# Patient Record
Sex: Female | Born: 1965 | Race: White | Hispanic: No | Marital: Married | Smoking: Never smoker
Health system: Southern US, Community
[De-identification: ages and names within clinical notes are randomized; demographics above are authoritative.]

## PROBLEM LIST (undated history)

## (undated) DIAGNOSIS — F329 Major depressive disorder, single episode, unspecified: Secondary | ICD-10-CM

## (undated) DIAGNOSIS — F32A Depression, unspecified: Secondary | ICD-10-CM

## (undated) DIAGNOSIS — N800 Endometriosis of uterus: Secondary | ICD-10-CM

## (undated) DIAGNOSIS — N809 Endometriosis, unspecified: Secondary | ICD-10-CM

## (undated) DIAGNOSIS — N8003 Adenomyosis of the uterus: Secondary | ICD-10-CM

## (undated) DIAGNOSIS — S0300XA Dislocation of jaw, unspecified side, initial encounter: Secondary | ICD-10-CM

## (undated) DIAGNOSIS — K589 Irritable bowel syndrome without diarrhea: Secondary | ICD-10-CM

## (undated) DIAGNOSIS — N946 Dysmenorrhea, unspecified: Secondary | ICD-10-CM

## (undated) DIAGNOSIS — K648 Other hemorrhoids: Secondary | ICD-10-CM

## (undated) DIAGNOSIS — F419 Anxiety disorder, unspecified: Secondary | ICD-10-CM

## (undated) DIAGNOSIS — N879 Dysplasia of cervix uteri, unspecified: Secondary | ICD-10-CM

## (undated) DIAGNOSIS — K5909 Other constipation: Secondary | ICD-10-CM

## (undated) DIAGNOSIS — E079 Disorder of thyroid, unspecified: Secondary | ICD-10-CM

## (undated) DIAGNOSIS — N898 Other specified noninflammatory disorders of vagina: Secondary | ICD-10-CM

## (undated) DIAGNOSIS — E538 Deficiency of other specified B group vitamins: Secondary | ICD-10-CM

## (undated) DIAGNOSIS — Z780 Asymptomatic menopausal state: Secondary | ICD-10-CM

## (undated) DIAGNOSIS — G43909 Migraine, unspecified, not intractable, without status migrainosus: Secondary | ICD-10-CM

## (undated) HISTORY — DX: Anxiety disorder, unspecified: F41.9

## (undated) HISTORY — DX: Depression, unspecified: F32.A

## (undated) HISTORY — DX: Migraine, unspecified, not intractable, without status migrainosus: G43.909

## (undated) HISTORY — PX: CARPAL TUNNEL RELEASE: SHX101

## (undated) HISTORY — DX: Hemochromatosis, unspecified: E83.119

## (undated) HISTORY — PX: HEMORRHOID BANDING: SHX5850

## (undated) HISTORY — DX: Other specified noninflammatory disorders of vagina: N89.8

## (undated) HISTORY — DX: Major depressive disorder, single episode, unspecified: F32.9

## (undated) HISTORY — PX: CERVICAL CONIZATION W/BX: SHX1330

## (undated) HISTORY — DX: Irritable bowel syndrome, unspecified: K58.9

## (undated) HISTORY — DX: Dysmenorrhea, unspecified: N94.6

## (undated) HISTORY — DX: Asymptomatic menopausal state: Z78.0

## (undated) HISTORY — DX: Other hemorrhoids: K64.8

## (undated) HISTORY — DX: Disorder of thyroid, unspecified: E07.9

## (undated) HISTORY — DX: Adenomyosis of the uterus: N80.03

## (undated) HISTORY — DX: Dislocation of jaw, unspecified side, initial encounter: S03.00XA

## (undated) HISTORY — PX: CERVICAL CERCLAGE: SHX1329

## (undated) HISTORY — DX: Endometriosis of uterus: N80.0

## (undated) HISTORY — DX: Dysplasia of cervix uteri, unspecified: N87.9

## (undated) HISTORY — DX: Other constipation: K59.09

## (undated) HISTORY — PX: LEEP: SHX91

## (undated) HISTORY — DX: Endometriosis, unspecified: N80.9

## (undated) HISTORY — DX: Deficiency of other specified B group vitamins: E53.8

---

## 2003-04-23 ENCOUNTER — Other Ambulatory Visit: Admission: RE | Admit: 2003-04-23 | Discharge: 2003-04-23 | Payer: Self-pay | Admitting: Obstetrics & Gynecology

## 2004-07-11 ENCOUNTER — Ambulatory Visit: Payer: Self-pay | Admitting: Obstetrics and Gynecology

## 2005-04-24 ENCOUNTER — Ambulatory Visit: Payer: Self-pay | Admitting: Psychiatry

## 2005-08-10 ENCOUNTER — Ambulatory Visit: Payer: Self-pay | Admitting: Rheumatology

## 2006-03-04 ENCOUNTER — Ambulatory Visit: Payer: Self-pay | Admitting: Internal Medicine

## 2006-03-10 ENCOUNTER — Ambulatory Visit: Payer: Self-pay | Admitting: Gynecologic Oncology

## 2006-04-21 ENCOUNTER — Ambulatory Visit: Payer: Self-pay | Admitting: Gynecologic Oncology

## 2006-06-01 ENCOUNTER — Ambulatory Visit: Payer: Self-pay | Admitting: Gynecologic Oncology

## 2007-02-17 ENCOUNTER — Ambulatory Visit: Payer: Self-pay

## 2007-02-19 ENCOUNTER — Ambulatory Visit: Payer: Self-pay | Admitting: Gastroenterology

## 2007-10-18 ENCOUNTER — Ambulatory Visit: Payer: Self-pay | Admitting: Gynecologic Oncology

## 2008-09-27 ENCOUNTER — Ambulatory Visit: Payer: Self-pay

## 2011-10-07 ENCOUNTER — Ambulatory Visit: Payer: Self-pay | Admitting: Obstetrics and Gynecology

## 2012-11-04 LAB — HM PAP SMEAR: HM PAP: NEGATIVE

## 2012-11-10 ENCOUNTER — Ambulatory Visit: Payer: Self-pay | Admitting: Obstetrics and Gynecology

## 2014-02-08 ENCOUNTER — Ambulatory Visit: Payer: Self-pay | Admitting: Obstetrics and Gynecology

## 2015-01-22 ENCOUNTER — Encounter: Payer: Self-pay | Admitting: Obstetrics and Gynecology

## 2015-02-07 ENCOUNTER — Ambulatory Visit (INDEPENDENT_AMBULATORY_CARE_PROVIDER_SITE_OTHER): Payer: BC Managed Care – PPO | Admitting: Obstetrics and Gynecology

## 2015-02-07 ENCOUNTER — Encounter: Payer: Self-pay | Admitting: Obstetrics and Gynecology

## 2015-02-07 VITALS — BP 123/76 | HR 76 | Ht 65.0 in | Wt 187.8 lb

## 2015-02-07 DIAGNOSIS — K589 Irritable bowel syndrome without diarrhea: Secondary | ICD-10-CM | POA: Diagnosis not present

## 2015-02-07 DIAGNOSIS — Z1239 Encounter for other screening for malignant neoplasm of breast: Secondary | ICD-10-CM

## 2015-02-07 DIAGNOSIS — K648 Other hemorrhoids: Secondary | ICD-10-CM

## 2015-02-07 DIAGNOSIS — Z0000 Encounter for general adult medical examination without abnormal findings: Secondary | ICD-10-CM | POA: Diagnosis not present

## 2015-02-07 DIAGNOSIS — N951 Menopausal and female climacteric states: Secondary | ICD-10-CM | POA: Insufficient documentation

## 2015-02-07 DIAGNOSIS — G43909 Migraine, unspecified, not intractable, without status migrainosus: Secondary | ICD-10-CM | POA: Insufficient documentation

## 2015-02-07 DIAGNOSIS — F418 Other specified anxiety disorders: Secondary | ICD-10-CM | POA: Insufficient documentation

## 2015-02-07 DIAGNOSIS — N809 Endometriosis, unspecified: Secondary | ICD-10-CM

## 2015-02-07 DIAGNOSIS — S0300XA Dislocation of jaw, unspecified side, initial encounter: Secondary | ICD-10-CM | POA: Insufficient documentation

## 2015-02-07 DIAGNOSIS — N8003 Adenomyosis of the uterus: Secondary | ICD-10-CM | POA: Insufficient documentation

## 2015-02-07 DIAGNOSIS — Z90710 Acquired absence of both cervix and uterus: Secondary | ICD-10-CM

## 2015-02-07 DIAGNOSIS — R638 Other symptoms and signs concerning food and fluid intake: Secondary | ICD-10-CM

## 2015-02-07 MED ORDER — ESTRADIOL 0.025 MG/24HR TD PTTW: 1.0000 | MEDICATED_PATCH | TRANSDERMAL | Status: DC

## 2015-02-07 MED ORDER — ESTRADIOL 0.1 MG/GM VA CREA: 1.0000 | TOPICAL_CREAM | Freq: Every day | VAGINAL | Status: AC

## 2015-02-07 MED ORDER — HYDROCORTISONE ACE-PRAMOXINE 1-1 % RE FOAM: 1.0000 | Freq: Two times a day (BID) | RECTAL | Status: DC

## 2015-02-07 NOTE — Patient Instructions (Addendum)
1.  No Pap smear this year.  We'll obtain Pap in 2017. 2.  Mammogram scheduled. 3.  Screening lab work ordered. 4.  Continue with healthy eating and exercise and ongoing weight loss!!. 5.  Return in 1 year for annual exam Preventive Care for Adults, Female A healthy lifestyle and preventive care can promote health and wellness. Preventive health guidelines for women include the following key practices.  A routine yearly physical is a good way to check with your health care provider about your health and preventive screening. It is a chance to share any concerns and updates on your health and to receive a thorough exam.  Visit your dentist for a routine exam and preventive care every 6 months. Brush your teeth twice a day and floss once a day. Good oral hygiene prevents tooth decay and gum disease.  The frequency of eye exams is based on your age, health, family medical history, use of contact lenses, and other factors. Follow your health care provider's recommendations for frequency of eye exams.  Eat a healthy diet. Foods like vegetables, fruits, whole grains, low-fat dairy products, and lean protein foods contain the nutrients you need without too many calories. Decrease your intake of foods high in solid fats, added sugars, and salt. Eat the right amount of calories for you.Get information about a proper diet from your health care provider, if necessary.  Regular physical exercise is one of the most important things you can do for your health. Most adults should get at least 150 minutes of moderate-intensity exercise (any activity that increases your heart rate and causes you to sweat) each week. In addition, most adults need muscle-strengthening exercises on 2 or more days a week.  Maintain a healthy weight. The body mass index (BMI) is a screening tool to identify possible weight problems. It provides an estimate of body fat based on height and weight. Your health care provider can find your  BMI and can help you achieve or maintain a healthy weight.For adults 20 years and older:  A BMI below 18.5 is considered underweight.  A BMI of 18.5 to 24.9 is normal.  A BMI of 25 to 29.9 is considered overweight.  A BMI of 30 and above is considered obese.  Maintain normal blood lipids and cholesterol levels by exercising and minimizing your intake of saturated fat. Eat a balanced diet with plenty of fruit and vegetables. Blood tests for lipids and cholesterol should begin at age 60 and be repeated every 5 years. If your lipid or cholesterol levels are high, you are over 50, or you are at high risk for heart disease, you may need your cholesterol levels checked more frequently.Ongoing high lipid and cholesterol levels should be treated with medicines if diet and exercise are not working.  If you smoke, find out from your health care provider how to quit. If you do not use tobacco, do not start.  Lung cancer screening is recommended for adults aged 72-80 years who are at high risk for developing lung cancer because of a history of smoking. A yearly low-dose CT scan of the lungs is recommended for people who have at least a 30-pack-year history of smoking and are a current smoker or have quit within the past 15 years. A pack year of smoking is smoking an average of 1 pack of cigarettes a day for 1 year (for example: 1 pack a day for 30 years or 2 packs a day for 15 years). Yearly screening should continue  until the smoker has stopped smoking for at least 15 years. Yearly screening should be stopped for people who develop a health problem that would prevent them from having lung cancer treatment.  If you are pregnant, do not drink alcohol. If you are breastfeeding, be very cautious about drinking alcohol. If you are not pregnant and choose to drink alcohol, do not have more than 1 drink per day. One drink is considered to be 12 ounces (355 mL) of beer, 5 ounces (148 mL) of wine, or 1.5 ounces (44  mL) of liquor.  Avoid use of street drugs. Do not share needles with anyone. Ask for help if you need support or instructions about stopping the use of drugs.  High blood pressure causes heart disease and increases the risk of stroke. Your blood pressure should be checked at least every 1 to 2 years. Ongoing high blood pressure should be treated with medicines if weight loss and exercise do not work.  If you are 87-28 years old, ask your health care provider if you should take aspirin to prevent strokes.  Diabetes screening is done by taking a blood sample to check your blood glucose level after you have not eaten for a certain period of time (fasting). If you are not overweight and you do not have risk factors for diabetes, you should be screened once every 3 years starting at age 48. If you are overweight or obese and you are 53-9 years of age, you should be screened for diabetes every year as part of your cardiovascular risk assessment.  Breast cancer screening is essential preventive care for women. You should practice "breast self-awareness." This means understanding the normal appearance and feel of your breasts and may include breast self-examination. Any changes detected, no matter how small, should be reported to a health care provider. Women in their 51s and 30s should have a clinical breast exam (CBE) by a health care provider as part of a regular health exam every 1 to 3 years. After age 62, women should have a CBE every year. Starting at age 55, women should consider having a mammogram (breast X-ray test) every year. Women who have a family history of breast cancer should talk to their health care provider about genetic screening. Women at a high risk of breast cancer should talk to their health care providers about having an MRI and a mammogram every year.  Breast cancer gene (BRCA)-related cancer risk assessment is recommended for women who have family members with BRCA-related cancers.  BRCA-related cancers include breast, ovarian, tubal, and peritoneal cancers. Having family members with these cancers may be associated with an increased risk for harmful changes (mutations) in the breast cancer genes BRCA1 and BRCA2. Results of the assessment will determine the need for genetic counseling and BRCA1 and BRCA2 testing.  Your health care provider may recommend that you be screened regularly for cancer of the pelvic organs (ovaries, uterus, and vagina). This screening involves a pelvic examination, including checking for microscopic changes to the surface of your cervix (Pap test). You may be encouraged to have this screening done every 3 years, beginning at age 46.  For women ages 55-65, health care providers may recommend pelvic exams and Pap testing every 3 years, or they may recommend the Pap and pelvic exam, combined with testing for human papilloma virus (HPV), every 5 years. Some types of HPV increase your risk of cervical cancer. Testing for HPV may also be done on women of any age with  unclear Pap test results.  Other health care providers may not recommend any screening for nonpregnant women who are considered low risk for pelvic cancer and who do not have symptoms. Ask your health care provider if a screening pelvic exam is right for you.  If you have had past treatment for cervical cancer or a condition that could lead to cancer, you need Pap tests and screening for cancer for at least 20 years after your treatment. If Pap tests have been discontinued, your risk factors (such as having a new sexual partner) need to be reassessed to determine if screening should resume. Some women have medical problems that increase the chance of getting cervical cancer. In these cases, your health care provider may recommend more frequent screening and Pap tests.  Colorectal cancer can be detected and often prevented. Most routine colorectal cancer screening begins at the age of 83 years and  continues through age 29 years. However, your health care provider may recommend screening at an earlier age if you have risk factors for colon cancer. On a yearly basis, your health care provider may provide home test kits to check for hidden blood in the stool. Use of a small camera at the end of a tube, to directly examine the colon (sigmoidoscopy or colonoscopy), can detect the earliest forms of colorectal cancer. Talk to your health care provider about this at age 67, when routine screening begins. Direct exam of the colon should be repeated every 5-10 years through age 12 years, unless early forms of precancerous polyps or small growths are found.  People who are at an increased risk for hepatitis B should be screened for this virus. You are considered at high risk for hepatitis B if:  You were born in a country where hepatitis B occurs often. Talk with your health care provider about which countries are considered high risk.  Your parents were born in a high-risk country and you have not received a shot to protect against hepatitis B (hepatitis B vaccine).  You have HIV or AIDS.  You use needles to inject street drugs.  You live with, or have sex with, someone who has hepatitis B.  You get hemodialysis treatment.  You take certain medicines for conditions like cancer, organ transplantation, and autoimmune conditions.  Hepatitis C blood testing is recommended for all people born from [birth year] through 1965 and any individual with known risks for hepatitis C.  Practice safe sex. Use condoms and avoid high-risk sexual practices to reduce the spread of sexually transmitted infections (STIs). STIs include gonorrhea, chlamydia, syphilis, trichomonas, herpes, HPV, and human immunodeficiency virus (HIV). Herpes, HIV, and HPV are viral illnesses that have no cure. They can result in disability, cancer, and death.  You should be screened for sexually transmitted illnesses (STIs) including gonorrhea  and chlamydia if:  You are sexually active and are younger than 24 years.  You are older than 24 years and your health care provider tells you that you are at risk for this type of infection.  Your sexual activity has changed since you were last screened and you are at an increased risk for chlamydia or gonorrhea. Ask your health care provider if you are at risk.  If you are at risk of being infected with HIV, it is recommended that you take a prescription medicine daily to prevent HIV infection. This is called preexposure prophylaxis (PrEP). You are considered at risk if:  You are sexually active and do not regularly use condoms or  know the HIV status of your partner(s).  You take drugs by injection.  You are sexually active with a partner who has HIV.  Talk with your health care provider about whether you are at high risk of being infected with HIV. If you choose to begin PrEP, you should first be tested for HIV. You should then be tested every 3 months for as long as you are taking PrEP.  Osteoporosis is a disease in which the bones lose minerals and strength with aging. This can result in serious bone fractures or breaks. The risk of osteoporosis can be identified using a bone density scan. Women ages 31 years and over and women at risk for fractures or osteoporosis should discuss screening with their health care providers. Ask your health care provider whether you should take a calcium supplement or vitamin D to reduce the rate of osteoporosis.  Menopause can be associated with physical symptoms and risks. Hormone replacement therapy is available to decrease symptoms and risks. You should talk to your health care provider about whether hormone replacement therapy is right for you.  Use sunscreen. Apply sunscreen liberally and repeatedly throughout the day. You should seek shade when your shadow is shorter than you. Protect yourself by wearing long sleeves, pants, a wide-brimmed hat, and  sunglasses year round, whenever you are outdoors.  Once a month, do a whole body skin exam, using a mirror to look at the skin on your back. Tell your health care provider of new moles, moles that have irregular borders, moles that are larger than a pencil eraser, or moles that have changed in shape or color.  Stay current with required vaccines (immunizations).  Influenza vaccine. All adults should be immunized every year.  Tetanus, diphtheria, and acellular pertussis (Td, Tdap) vaccine. Pregnant women should receive 1 dose of Tdap vaccine during each pregnancy. The dose should be obtained regardless of the length of time since the last dose. Immunization is preferred during the 27th-36th week of gestation. An adult who has not previously received Tdap or who does not know her vaccine status should receive 1 dose of Tdap. This initial dose should be followed by tetanus and diphtheria toxoids (Td) booster doses every 10 years. Adults with an unknown or incomplete history of completing a 3-dose immunization series with Td-containing vaccines should begin or complete a primary immunization series including a Tdap dose. Adults should receive a Td booster every 10 years.  Varicella vaccine. An adult without evidence of immunity to varicella should receive 2 doses or a second dose if she has previously received 1 dose. Pregnant females who do not have evidence of immunity should receive the first dose after pregnancy. This first dose should be obtained before leaving the health care facility. The second dose should be obtained 4-8 weeks after the first dose.  Human papillomavirus (HPV) vaccine. Females aged 13-26 years who have not received the vaccine previously should obtain the 3-dose series. The vaccine is not recommended for use in pregnant females. However, pregnancy testing is not needed before receiving a dose. If a female is found to be pregnant after receiving a dose, no treatment is needed. In that  case, the remaining doses should be delayed until after the pregnancy. Immunization is recommended for any person with an immunocompromised condition through the age of 66 years if she did not get any or all doses earlier. During the 3-dose series, the second dose should be obtained 4-8 weeks after the first dose. The third dose  should be obtained 24 weeks after the first dose and 16 weeks after the second dose.  Zoster vaccine. One dose is recommended for adults aged 83 years or older unless certain conditions are present.  Measles, mumps, and rubella (MMR) vaccine. Adults born before [birth year] generally are considered immune to measles and mumps. Adults born in [birth year] or later should have 1 or more doses of MMR vaccine unless there is a contraindication to the vaccine or there is laboratory evidence of immunity to each of the three diseases. A routine second dose of MMR vaccine should be obtained at least 28 days after the first dose for students attending postsecondary schools, health care workers, or international travelers. People who received inactivated measles vaccine or an unknown type of measles vaccine during 1963-1967 should receive 2 doses of MMR vaccine. People who received inactivated mumps vaccine or an unknown type of mumps vaccine before 1979 and are at high risk for mumps infection should consider immunization with 2 doses of MMR vaccine. For females of childbearing age, rubella immunity should be determined. If there is no evidence of immunity, females who are not pregnant should be vaccinated. If there is no evidence of immunity, females who are pregnant should delay immunization until after pregnancy. Unvaccinated health care workers born before [birth year] who lack laboratory evidence of measles, mumps, or rubella immunity or laboratory confirmation of disease should consider measles and mumps immunization with 2 doses of MMR vaccine or rubella immunization with 1 dose of MMR vaccine.  Pneumococcal  13-valent conjugate (PCV13) vaccine. When indicated, a person who is uncertain of his immunization history and has no record of immunization should receive the PCV13 vaccine. All adults 71 years of age and older should receive this vaccine. An adult aged 100 years or older who has certain medical conditions and has not been previously immunized should receive 1 dose of PCV13 vaccine. This PCV13 should be followed with a dose of pneumococcal polysaccharide (PPSV23) vaccine. Adults who are at high risk for pneumococcal disease should obtain the PPSV23 vaccine at least 8 weeks after the dose of PCV13 vaccine. Adults older than 49 years of age who have normal immune system function should obtain the PPSV23 vaccine dose at least 1 year after the dose of PCV13 vaccine.  Pneumococcal polysaccharide (PPSV23) vaccine. When PCV13 is also indicated, PCV13 should be obtained first. All adults aged 28 years and older should be immunized. An adult younger than age 57 years who has certain medical conditions should be immunized. Any person who resides in a nursing home or long-term care facility should be immunized. An adult smoker should be immunized. People with an immunocompromised condition and certain other conditions should receive both PCV13 and PPSV23 vaccines. People with human immunodeficiency virus (HIV) infection should be immunized as soon as possible after diagnosis. Immunization during chemotherapy or radiation therapy should be avoided. Routine use of PPSV23 vaccine is not recommended for American Indians, [place] Natives, or people younger than 65 years unless there are medical conditions that require PPSV23 vaccine. When indicated, people who have unknown immunization and have no record of immunization should receive PPSV23 vaccine. One-time revaccination 5 years after the first dose of PPSV23 is recommended for people aged 19-64 years who have chronic kidney failure, nephrotic syndrome, asplenia, or  immunocompromised conditions. People who received 1-2 doses of PPSV23 before age 71 years should receive another dose of PPSV23 vaccine at age 57 years or later if at least 5 years have passed since the previous  dose. Doses of PPSV23 are not needed for people immunized with PPSV23 at or after age 63 years.  Meningococcal vaccine. Adults with asplenia or persistent complement component deficiencies should receive 2 doses of quadrivalent meningococcal conjugate (MenACWY-D) vaccine. The doses should be obtained at least 2 months apart. Microbiologists working with certain meningococcal bacteria, [employer] recruits, people at risk during an outbreak, and people who travel to or live in countries with a high rate of meningitis should be immunized. A first-year college student up through age 37 years who is living in a residence hall should receive a dose if she did not receive a dose on or after her 16th birthday. Adults who have certain high-risk conditions should receive one or more doses of vaccine.  Hepatitis A vaccine. Adults who wish to be protected from this disease, have certain high-risk conditions, work with hepatitis A-infected animals, work in hepatitis A research labs, or travel to or work in countries with a high rate of hepatitis A should be immunized. Adults who were previously unvaccinated and who anticipate close contact with an international adoptee during the first 60 days after arrival in the [place] from a country with a high rate of hepatitis A should be immunized.  Hepatitis B vaccine. Adults who wish to be protected from this disease, have certain high-risk conditions, may be exposed to blood or other infectious body fluids, are household contacts or sex partners of hepatitis B positive people, are clients or workers in certain care facilities, or travel to or work in countries with a high rate of hepatitis B should be immunized.  Haemophilus influenzae type b (Hib) vaccine. A  previously unvaccinated person with asplenia or sickle cell disease or having a scheduled splenectomy should receive 1 dose of Hib vaccine. Regardless of previous immunization, a recipient of a hematopoietic stem cell transplant should receive a 3-dose series 6-12 months after her successful transplant. Hib vaccine is not recommended for adults with HIV infection. Preventive Services / Frequency   Ages 74 to 12 years  Blood pressure check.** / Every year.  Lipid and cholesterol check.** / Every 5 years beginning at age 44 years.  Lung cancer screening. / Every year if you are aged 90-80 years and have a 30-pack-year history of smoking and currently smoke or have quit within the past 15 years. Yearly screening is stopped once you have quit smoking for at least 15 years or develop a health problem that would prevent you from having lung cancer treatment.  Clinical breast exam.** / Every year after age 69 years.  BRCA-related cancer risk assessment.** / For women who have family members with a BRCA-related cancer (breast, ovarian, tubal, or peritoneal cancers).  Mammogram.** / Every year beginning at age 93 years and continuing for as long as you are in good health. Consult with your health care provider.  Pap test.** / Every 3 years starting at age 37 years through age 33 or 81 years with a history of 3 consecutive normal Pap tests.  HPV screening.** / Every 3 years from ages 40 years through ages 31 to 5 years with a history of 3 consecutive normal Pap tests.  Fecal occult blood test (FOBT) of stool. / Every year beginning at age 81 years and continuing until age 6 years. You may not need to do this test if you get a colonoscopy every 10 years.  Flexible sigmoidoscopy or colonoscopy.** / Every 5 years for a flexible sigmoidoscopy or every 10 years for a colonoscopy beginning  at age 90 years and continuing until age 21 years.  Hepatitis C blood test.** / For all people born from [birth year]  through 1965 and any individual with known risks for hepatitis C.  Skin self-exam. / Monthly.  Influenza vaccine. / Every year.  Tetanus, diphtheria, and acellular pertussis (Tdap/Td) vaccine.** / Consult your health care provider. Pregnant women should receive 1 dose of Tdap vaccine during each pregnancy. 1 dose of Td every 10 years.  Varicella vaccine.** / Consult your health care provider. Pregnant females who do not have evidence of immunity should receive the first dose after pregnancy.  Zoster vaccine.** / 1 dose for adults aged 57 years or older.  Measles, mumps, rubella (MMR) vaccine.** / You need at least 1 dose of MMR if you were born in 1957 or later. You may also need a second dose. For females of childbearing age, rubella immunity should be determined. If there is no evidence of immunity, females who are not pregnant should be vaccinated. If there is no evidence of immunity, females who are pregnant should delay immunization until after pregnancy.  Pneumococcal 13-valent conjugate (PCV13) vaccine.** / Consult your health care provider.  Pneumococcal polysaccharide (PPSV23) vaccine.** / 1 to 2 doses if you smoke cigarettes or if you have certain conditions.  Meningococcal vaccine.** / Consult your health care provider.  Hepatitis A vaccine.** / Consult your health care provider.  Hepatitis B vaccine.** / Consult your health care provider.  Haemophilus influenzae type b (Hib) vaccine.** / Consult your health care provider. h care provider's recommendations.   This information is not intended to replace advice given to you by your health care provider. Make sure you discuss any questions you have with your health care provider.   Document Released: 04/21/2001 Document Revised: 03/16/2014 Document Reviewed: 07/21/2010 Elsevier Interactive Patient Education [employer].

## 2015-02-07 NOTE — Progress Notes (Signed)
Patient ID: Virginia Oconnor, female   DOB: 01/25/1966, 49 y.o.   MRN: [id number] ANNUAL PREVENTATIVE CARE GYN  ENCOUNTER NOTE  Subjective:       Virginia KirkDawn Sautters Bulow is a 49 y.o. [phone] female here for a routine annual gynecologic exam.  Current complaints: 1.  none   Patient has lost 13 pounds this year.  She is on Synthroid for hypothyroidism.  Bowel and bladder function are normal.   Gynecologic History No LMP recorded. Patient has had a hysterectomy. Contraception: status post hysterectomyTransvaginal hysterectomy Last Pap: 10/2012 n/n. Results were: normal Last mammogram: 02/09/2014 neg. Results were: normal  Obstetric History OB History  Gravida Para Term Preterm AB SAB TAB Ectopic Multiple Living  5 1 1  4 4    1     # Outcome Date GA Lbr Len/2nd Weight Sex Delivery Anes PTL Lv  5 SAB 2002          4 SAB 1996          3 SAB 1996          2 Term 1995     Vag-Spont   Y  1 SAB 1994              Past Medical History  Diagnosis Date  . Menopause   . Cervical dysplasia   . Dysmenorrhea     h/o  . Migraines   . TMJ (dislocation of temporomandibular joint)   . Adenomyosis   . Internal hemorrhoid   . Anxiety   . Depression   . IBS (irritable bowel syndrome)   . Vaginal dryness   . Chronic constipation   . Thyroid disease     hypo    Past Surgical History  Procedure Laterality Date  . Hemorrhoid banding    . Leep    . Carpal tunnel release    . Cervical cerclage    . Cervical conization w/bx      Current Outpatient Prescriptions on File Prior to Visit  Medication Sig Dispense Refill  . estradiol (ESTRACE) 0.1 MG/GM vaginal cream Place 1 Applicatorful vaginally at bedtime.    Marland Kitchen. estradiol (VIVELLE-DOT) 0.025 MG/24HR Place 1 patch onto the skin 2 (two) times a week.     No current facility-administered medications on file prior to visit.    No Known Allergies  Social History   Social History  . Marital Status: Married    Spouse Name: N/A  .  Number of Children: N/A  . Years of Education: N/A   Occupational History  . Not on file.   Social History Main Topics  . Smoking status: Never Smoker   . Smokeless tobacco: Not on file  . Alcohol Use: Yes     Comment: occas  . Drug Use: No  . Sexual Activity: Yes    Birth Control/ Protection: Surgical   Other Topics Concern  . Not on file   Social History Narrative    Family History  Problem Relation Age of Onset  . Hodgkin's lymphoma Mother   . Colon cancer Maternal Uncle   . Breast cancer Paternal Aunt   . Colon cancer Paternal Uncle   . Prostate cancer Paternal Uncle   . Lung cancer Paternal Grandfather   . Prostate cancer Paternal Grandfather   . Diabetes Neg Hx   . Heart disease Neg Hx   . Ovarian cancer Neg Hx   . Prostate cancer Father     The following portions of the patient's history were  reviewed and updated as appropriate: allergies, current medications, past family history, past medical history, past social history, past surgical history and problem list.  Review of Systems ROS Review of Systems - General ROS: negative for - chills, fatigue, fever, hot flashes, night sweats, weight gain or weight loss Psychological ROS: negative for - anxiety, decreased libido, depression, mood swings, physical abuse or sexual abuse Ophthalmic ROS: negative for - blurry vision, eye pain or loss of vision ENT ROS: negative for - headaches, hearing change, visual changes or vocal changes Allergy and Immunology ROS: negative for - hives, itchy/watery eyes or seasonal allergies Hematological and Lymphatic ROS: negative for - bleeding problems, bruising, swollen lymph nodes or weight loss Endocrine ROS: negative for - galactorrhea, hair pattern changes, hot flashes, malaise/lethargy, mood swings, palpitations, polydipsia/polyuria, skin changes, temperature intolerance or unexpected weight changes Breast ROS: negative for - new or changing breast lumps or nipple  discharge Respiratory ROS: negative for - cough or shortness of breath Cardiovascular ROS: negative for - chest pain, irregular heartbeat, palpitations or shortness of breath Gastrointestinal ROS: no abdominal pain, change in bowel habits, or black or bloody stools Genito-Urinary ROS: no dysuria, trouble voiding, or hematuria Musculoskeletal ROS: negative for - joint pain or joint stiffness Neurological ROS: negative for - bowel and bladder control changes Dermatological ROS: negative for rash and skin lesion changes   Objective:   BP 123/76 mmHg  Pulse 76  Ht  (1.651 m)  Wt 187 lb 12.8 oz (85.186 kg)  BMI 31.25 kg/m2 CONSTITUTIONAL: Well-developed, well-nourished female in no acute distress.  PSYCHIATRIC: Normal mood and affect. Normal behavior. Normal judgment and thought content. NEUROLGIC: Alert and oriented to person, place, and time. Normal muscle tone coordination. No cranial nerve deficit noted. HENT:  Normocephalic, atraumatic, External right and left ear normal. Oropharynx is clear and moist NECK: Normal range of motion, supple, no masses.  Normal thyroid.  SKIN: Skin is warm and dry. No rash noted. Not diaphoretic. No erythema. No pallor. CARDIOVASCULAR: Normal heart rate noted, regular rhythm, no murmur. RESPIRATORY: Clear to auscultation bilaterally. Effort and breath sounds normal, no problems with respiration noted. BREASTS: Symmetric in size. No masses, skin changes, nipple drainage, or lymphadenopathy. ABDOMEN: Soft, normal bowel sounds, no distention noted.  No tenderness, rebound or guarding.  BLADDER: Normal PELVIC:  External Genitalia: Normal  BUS: Normal  Vagina: Normal; Good vault support; Estrogen cream in vault  Cervix: Surgically absent  Uterus: Surgically absent  Adnexa: Normal; No masses, no tenderness  RV: external hemorrhoid, External Exam NormaI, No Rectal Masses and Normal Sphincter tone  MUSCULOSKELETAL: Normal range of motion. No tenderness.   No cyanosis, clubbing, or edema.  2+ distal pulses. LYMPHATIC: No Axillary, Supraclavicular, or Inguinal Adenopathy.    Assessment:   Annual gynecologic examination 49 y.o. Contraception: status post hysterectomyTVH bmi-31 - down 13#!! Hemorrhoids  Plan:  Pap: Not needed Mammogram: Ordered Stool Guaiac Testing:  Not Indicated Labs: lipids A1c Routine preventative health maintenance measures emphasized: Exercise/Diet/Weight control, Tobacco Warnings and Alcohol/Substance use risks Proctofoam for hemorrhoids prescribed. Return to Clinic - 1 [address] [place], [place]  Herold Harms, MD  Note: This dictation was prepared with Dragon dictation along with smaller phrase technology. Any transcriptional errors that result from this process are unintentional.

## 2015-02-10 ENCOUNTER — Other Ambulatory Visit: Payer: Self-pay | Admitting: Obstetrics and Gynecology

## 2015-02-28 ENCOUNTER — Other Ambulatory Visit: Payer: BC Managed Care – PPO

## 2015-02-28 ENCOUNTER — Ambulatory Visit
Admission: RE | Admit: 2015-02-28 | Discharge: 2015-02-28 | Disposition: A | Discharge status: Home / Self Care | Payer: BC Managed Care – PPO | Source: Ambulatory Visit | Attending: Obstetrics and Gynecology | Admitting: Obstetrics and Gynecology

## 2015-02-28 DIAGNOSIS — Z1231 Encounter for screening mammogram for malignant neoplasm of breast: Secondary | ICD-10-CM | POA: Insufficient documentation

## 2015-02-28 DIAGNOSIS — Z1239 Encounter for other screening for malignant neoplasm of breast: Secondary | ICD-10-CM

## 2015-03-01 LAB — LIPID PANEL
CHOL/HDL RATIO: 3.8 ratio (ref 0.0–4.4)
Cholesterol, Total: 188 mg/dL (ref 100–199)
HDL: 50 mg/dL (ref >39)
LDL Calculated: 115 mg/dL — ABNORMAL HIGH (ref 0–99)
Triglycerides: 116 mg/dL (ref 0–149)
VLDL CHOLESTEROL CAL: 23 mg/dL (ref 5–40)

## 2015-03-01 LAB — HEMOGLOBIN A1C
ESTIMATED AVERAGE GLUCOSE: 103 mg/dL
HEMOGLOBIN A1C: 5.2 % (ref 4.8–5.6)

## 2015-08-13 DIAGNOSIS — K5909 Other constipation: Secondary | ICD-10-CM | POA: Insufficient documentation

## 2015-08-13 DIAGNOSIS — E785 Hyperlipidemia, unspecified: Secondary | ICD-10-CM | POA: Insufficient documentation

## 2015-08-13 DIAGNOSIS — Z8742 Personal history of other diseases of the female genital tract: Secondary | ICD-10-CM | POA: Insufficient documentation

## 2015-08-13 DIAGNOSIS — E034 Atrophy of thyroid (acquired): Secondary | ICD-10-CM | POA: Insufficient documentation

## 2016-02-12 ENCOUNTER — Encounter: Payer: BC Managed Care – PPO | Admitting: Obstetrics and Gynecology

## 2016-02-28 ENCOUNTER — Other Ambulatory Visit: Payer: Self-pay | Admitting: Obstetrics and Gynecology

## 2016-02-28 NOTE — Telephone Encounter (Signed)
This PT NEEDS   VIVELLE PATCH. REFILLED HAS AN APPT IN JAN TO SEE DR DE. [facility]

## 2016-03-19 ENCOUNTER — Encounter: Payer: BC Managed Care – PPO | Admitting: Obstetrics and Gynecology

## 2016-03-24 NOTE — Progress Notes (Deleted)
Patient ID: Virginia Oconnor, female   DOB: 10/09/1965, 51 y.o.   MRN: [id number] ANNUAL PREVENTATIVE CARE GYN  ENCOUNTER NOTE  Subjective:       Virginia Oconnor is a 51 y.o. [phone] female here for a routine annual gynecologic exam.  Current complaints: 1.  none      Gynecologic History No LMP recorded. Patient has had a hysterectomy. Contraception: status post hysterectomyTransvaginal hysterectomy Last Pap: 10/2012 n/n. Results were: normal Last mammogram: 02/28/2015 birad 1. Results were: normal  Obstetric History OB History  Gravida Para Term Preterm AB Living  5 1 1   4 1   SAB TAB Ectopic Multiple Live Births  4       1    # Outcome Date GA Lbr Len/2nd Weight Sex Delivery Anes PTL Lv  5 SAB 2002          4 SAB 1996          3 SAB 1996          2 Term 1995     Vag-Spont   LIV  1 SAB 1994              Past Medical History:  Diagnosis Date  . Adenomyosis   . Anxiety   . Cervical dysplasia   . Chronic constipation   . Depression   . Dysmenorrhea    h/o  . IBS (irritable bowel syndrome)   . Internal hemorrhoid   . Menopause   . Migraines   . Thyroid disease    hypo  . TMJ (dislocation of temporomandibular joint)   . Vaginal dryness     Past Surgical History:  Procedure Laterality Date  . CARPAL TUNNEL RELEASE    . CERVICAL CERCLAGE    . CERVICAL CONIZATION W/BX    . HEMORRHOID BANDING    . LEEP      Current Outpatient Prescriptions on File Prior to Visit  Medication Sig Dispense Refill  . estradiol (ESTRACE) 0.1 MG/GM vaginal cream Place 1 Applicatorful vaginally at bedtime. 42.5 g 6  . estradiol (VIVELLE-DOT) 0.025 MG/24HR APPLY PATCH TO SKIN TWICE WEEKLY REMOVE AND DISCARD OLD PATCH 8 patch 0  . hydrocortisone-pramoxine (PROCTOFOAM HC) rectal foam Place 1 applicator rectally 2 (two) times daily. 10 g 3  . levothyroxine (SYNTHROID, LEVOTHROID) 50 MCG tablet Take by mouth.    Marland Kitchen. Linaclotide (LINZESS) 290 MCG CAPS capsule Take by mouth.      No current facility-administered medications on file prior to visit.     No Known Allergies  Social History   Social History  . Marital status: Married    Spouse name: N/A  . Number of children: N/A  . Years of education: N/A   Occupational History  . Not on file.   Social History Main Topics  . Smoking status: Never Smoker  . Smokeless tobacco: Not on file  . Alcohol use Yes     Comment: occas  . Drug use: No  . Sexual activity: Yes    Birth control/ protection: Surgical   Other Topics Concern  . Not on file   Social History Narrative  . No narrative on file    Family History  Problem Relation Age of Onset  . Hodgkin's lymphoma Mother   . Colon cancer Maternal Uncle   . Breast cancer Paternal Aunt     great Aunts x 3  . Colon cancer Paternal Uncle   . Prostate cancer Paternal Uncle   . Lung cancer  Paternal Grandfather   . Prostate cancer Paternal Grandfather   . Diabetes Neg Hx   . Heart disease Neg Hx   . Ovarian cancer Neg Hx   . Prostate cancer Father     The following portions of the patient's history were reviewed and updated as appropriate: allergies, current medications, past family history, past medical history, past social history, past surgical history and problem list.  Review of Systems ROS Review of Systems - General ROS: negative for - chills, fatigue, fever, hot flashes, night sweats, weight gain or weight loss Psychological ROS: negative for - anxiety, decreased libido, depression, mood swings, physical abuse or sexual abuse Ophthalmic ROS: negative for - blurry vision, eye pain or loss of vision ENT ROS: negative for - headaches, hearing change, visual changes or vocal changes Allergy and Immunology ROS: negative for - hives, itchy/watery eyes or seasonal allergies Hematological and Lymphatic ROS: negative for - bleeding problems, bruising, swollen lymph nodes or weight loss Endocrine ROS: negative for - galactorrhea, hair pattern  changes, hot flashes, malaise/lethargy, mood swings, palpitations, polydipsia/polyuria, skin changes, temperature intolerance or unexpected weight changes Breast ROS: negative for - new or changing breast lumps or nipple discharge Respiratory ROS: negative for - cough or shortness of breath Cardiovascular ROS: negative for - chest pain, irregular heartbeat, palpitations or shortness of breath Gastrointestinal ROS: no abdominal pain, change in bowel habits, or black or bloody stools Genito-Urinary ROS: no dysuria, trouble voiding, or hematuria Musculoskeletal ROS: negative for - joint pain or joint stiffness Neurological ROS: negative for - bowel and bladder control changes Dermatological ROS: negative for rash and skin lesion changes   Objective:   There were no vitals taken for this visit. CONSTITUTIONAL: Well-developed, well-nourished female in no acute distress.  PSYCHIATRIC: Normal mood and affect. Normal behavior. Normal judgment and thought content. NEUROLGIC: Alert and oriented to person, place, and time. Normal muscle tone coordination. No cranial nerve deficit noted. HENT:  Normocephalic, atraumatic, External right and left ear normal. Oropharynx is clear and moist NECK: Normal range of motion, supple, no masses.  Normal thyroid.  SKIN: Skin is warm and dry. No rash noted. Not diaphoretic. No erythema. No pallor. CARDIOVASCULAR: Normal heart rate noted, regular rhythm, no murmur. RESPIRATORY: Clear to auscultation bilaterally. Effort and breath sounds normal, no problems with respiration noted. BREASTS: Symmetric in size. No masses, skin changes, nipple drainage, or lymphadenopathy. ABDOMEN: Soft, normal bowel sounds, no distention noted.  No tenderness, rebound or guarding.  BLADDER: Normal PELVIC:  External Genitalia: Normal  BUS: Normal  Vagina: Normal; Good vault support; Estrogen cream in vault  Cervix: Surgically absent  Uterus: Surgically absent  Adnexa: Normal; No  masses, no tenderness  RV: external hemorrhoid, External Exam NormaI, No Rectal Masses and Normal Sphincter tone  MUSCULOSKELETAL: Normal range of motion. No tenderness.  No cyanosis, clubbing, or edema.  2+ distal pulses. LYMPHATIC: No Axillary, Supraclavicular, or Inguinal Adenopathy.    Assessment:   Annual gynecologic examination 51 y.o. Contraception: status post hysterectomyTVH BMI- Hemorrhoids  Plan:  Pap: Not needed Mammogram: Ordered Stool Guaiac Testing: Colonoscopy due-  Labs: -Lipids Routine preventative health maintenance measures emphasized: Exercise/Diet/Weight control, Tobacco Warnings and Alcohol/Substance use risks Return to Clinic - 1 Year   Darol Destine, [place]   Note: This dictation was prepared with Dragon dictation along with smaller phrase technology. Any transcriptional errors that result from this process are unintentional.

## 2016-04-01 ENCOUNTER — Encounter: Payer: BC Managed Care – PPO | Admitting: Obstetrics and Gynecology

## 2016-04-07 ENCOUNTER — Telehealth: Payer: Self-pay | Admitting: Obstetrics and Gynecology

## 2016-04-07 MED ORDER — ESTRADIOL 0.025 MG/24HR TD PTTW: 1.0000 | MEDICATED_PATCH | TRANSDERMAL | 1 refills | Status: DC

## 2016-04-07 NOTE — Telephone Encounter (Signed)
Pt aware med erx. 

## 2016-04-07 NOTE — Telephone Encounter (Signed)
Pt had to reschedule her appt bc Dr Tommi Rumpsde was out of town, now she will need more Estradiol patches until she sees him again in March 7... [facility]

## 2016-04-15 ENCOUNTER — Other Ambulatory Visit: Payer: Self-pay | Admitting: Obstetrics and Gynecology

## 2016-05-08 NOTE — Progress Notes (Signed)
Patient ID: Virginia Oconnor, female   DOB: 02-Oct-1965, 51 y.o.   MRN: [id number] ANNUAL PREVENTATIVE CARE GYN  ENCOUNTER NOTE  Subjective:       Virginia Oconnor is a 51 y.o. [phone] female here for a routine annual gynecologic exam.  Current complaints: 1.  Rectocele???  Patient is concerned about possible rectocele. She often has to splint with bowel movements including occasional disimpaction. She is not routinely using adequate fiber or stool softeners. She does use Linzess for IBS. Patient is interested in having follow-up colonoscopy-provider recommendation? Patient is exercising and eating healthy but has maintained stability in her weight.    She is on Synthroid for hypothyroidism.  Bowel and bladder function are normal.   Gynecologic History No LMP recorded. Patient has had a hysterectomy. Contraception: status post hysterectomyTransvaginal hysterectomy Last Pap: 10/2012 n/n. Results were: normal Last mammogram: 02/28/2015 birad 1. Results were: normal  Obstetric History OB History  Gravida Para Term Preterm AB Living  5 1 1   4 1   SAB TAB Ectopic Multiple Live Births  4       1    # Outcome Date GA Lbr Len/2nd Weight Sex Delivery Anes PTL Lv  5 SAB 2002          4 SAB 1996          3 SAB 1996          2 Term 1995     Vag-Spont   LIV  1 SAB 1994              Past Medical History:  Diagnosis Date  . Adenomyosis   . Anxiety   . Cervical dysplasia   . Chronic constipation   . Depression   . Dysmenorrhea    h/o  . IBS (irritable bowel syndrome)   . Internal hemorrhoid   . Menopause   . Migraines   . Thyroid disease    hypo  . TMJ (dislocation of temporomandibular joint)   . Vaginal dryness     Past Surgical History:  Procedure Laterality Date  . CARPAL TUNNEL RELEASE    . CERVICAL CERCLAGE    . CERVICAL CONIZATION W/BX    . HEMORRHOID BANDING    . LEEP      Current Outpatient Prescriptions on File Prior to Visit  Medication Sig Dispense  Refill  . estradiol (ESTRACE) 0.1 MG/GM vaginal cream Place 1 Applicatorful vaginally at bedtime. 42.5 g 6  . estradiol (VIVELLE-DOT) 0.025 MG/24HR Place 1 patch onto the skin 2 (two) times a week. 8 patch 1  . hydrocortisone-pramoxine (PROCTOFOAM HC) rectal foam Place 1 applicator rectally 2 (two) times daily. 10 g 3  . levothyroxine (SYNTHROID, LEVOTHROID) 50 MCG tablet Take by mouth.    Marland Kitchen Linaclotide (LINZESS) 290 MCG CAPS capsule Take by mouth.     No current facility-administered medications on file prior to visit.     No Known Allergies  Social History   Social History  . Marital status: Married    Spouse name: N/A  . Number of children: N/A  . Years of education: N/A   Occupational History  . Not on file.   Social History Main Topics  . Smoking status: Never Smoker  . Smokeless tobacco: Not on file  . Alcohol use Yes     Comment: occas  . Drug use: No  . Sexual activity: Yes    Birth control/ protection: Surgical   Other Topics Concern  . Not on  file   Social History Narrative  . No narrative on file    Family History  Problem Relation Age of Onset  . Hodgkin's lymphoma Mother   . Colon cancer Maternal Uncle   . Breast cancer Paternal Aunt     great Aunts x 3  . Colon cancer Paternal Uncle   . Prostate cancer Paternal Uncle   . Lung cancer Paternal Grandfather   . Prostate cancer Paternal Grandfather   . Diabetes Neg Hx   . Heart disease Neg Hx   . Ovarian cancer Neg Hx   . Prostate cancer Father     The following portions of the patient's history were reviewed and updated as appropriate: allergies, current medications, past family history, past medical history, past social history, past surgical history and problem list.  Review of Systems Review of Systems  Constitutional: Negative.   HENT: Negative.   Eyes: Negative.   Respiratory: Negative.   Gastrointestinal: Positive for constipation. Negative for blood in stool and melena.       History of  IBS  Genitourinary: Negative.   Musculoskeletal: Negative.   Skin: Negative.   Neurological: Negative.   Endo/Heme/Allergies: Negative.   Psychiatric/Behavioral: Negative.        History of anxiety, stable     Objective:   BP 128/76   Pulse 80   Ht 5\' 5"  (1.651 m)   Wt 187 lb (84.8 kg)   BMI 31.12 kg/m   CONSTITUTIONAL: Well-developed, well-nourished female in no acute distress.  PSYCHIATRIC: Normal mood and affect. Normal behavior. Normal judgment and thought content. NEUROLGIC: Alert and oriented to person, place, and time. Normal muscle tone coordination. No cranial nerve deficit noted. HENT:  Normocephalic, atraumatic, External right and left ear normal.  NECK: Normal range of motion, supple, no masses.  Normal thyroid.  SKIN: Skin is warm and dry. No rash noted. Not diaphoretic. No erythema. No pallor. CARDIOVASCULAR: Normal heart rate noted, regular rhythm, no murmur. RESPIRATORY: Clear to auscultation bilaterally. Effort and breath sounds normal, no problems with respiration noted. BREASTS: Symmetric in size. No masses, skin changes, nipple drainage, or lymphadenopathy. ABDOMEN: Soft, normal bowel sounds, no distention noted.  No tenderness, rebound or guarding.  BLADDER: Normal PELVIC:  External Genitalia: Normal  BUS: Normal  Vagina: Normal; Good vault support; good estrogen effect; no lesions  Cervix: Surgically absent  Uterus: Surgically absent  Adnexa: Normal; No masses, no tenderness  RV: external hemorrhoid, External Exam NormaI, No Rectal Masses and Normal Sphincter tone  MUSCULOSKELETAL: Normal range of motion. No tenderness.  No cyanosis, clubbing, or edema.  2+ distal pulses. LYMPHATIC: No Axillary, Supraclavicular, or Inguinal Adenopathy.    Assessment:   Annual gynecologic examination 51 y.o. Contraception: status post hysterectomyTVH History of adenomyosis History of abnormal Pap smear; last Pap smear 2014-negative/negative; no further paps  necessary unless new lesion is identified or iris fever develops BMI-31; working on weight loss Chronic constipation; small rectocele; did not feel this is significant enough to warrant surgery at this time Desires GI referral    Plan:  Pap: Not needed Mammogram: Ordered Stool Guaiac Testing:  Colonoscopy- Referral to [facility]- Dr. Yancey Flemings Labs: pcp Routine preventative health maintenance measures emphasized: Exercise/Diet/Weight control, Tobacco Warnings and Alcohol/Substance use risks  Recommend any stool softener twice a day, increased roughage in diet, increase water intake Do not recommend rectocele repair at this time Return to Clinic - 1 Year   [employer], CMA  Herold Harms, MD  Note: This  dictation was prepared with Dragon dictation along with smaller phrase technology. Any transcriptional errors that result from this process are unintentional.   Note: This dictation was prepared with Dragon dictation along with smaller phrase technology. Any transcriptional errors that result from this process are unintentional.

## 2016-05-13 ENCOUNTER — Encounter: Payer: Self-pay | Admitting: Obstetrics and Gynecology

## 2016-05-13 ENCOUNTER — Ambulatory Visit (INDEPENDENT_AMBULATORY_CARE_PROVIDER_SITE_OTHER): Payer: BC Managed Care – PPO | Admitting: Obstetrics and Gynecology

## 2016-05-13 VITALS — BP 128/76 | HR 80 | Ht 65.0 in | Wt 187.0 lb

## 2016-05-13 DIAGNOSIS — Z1211 Encounter for screening for malignant neoplasm of colon: Secondary | ICD-10-CM

## 2016-05-13 DIAGNOSIS — R638 Other symptoms and signs concerning food and fluid intake: Secondary | ICD-10-CM | POA: Diagnosis not present

## 2016-05-13 DIAGNOSIS — Z90710 Acquired absence of both cervix and uterus: Secondary | ICD-10-CM | POA: Diagnosis not present

## 2016-05-13 DIAGNOSIS — N951 Menopausal and female climacteric states: Secondary | ICD-10-CM | POA: Diagnosis not present

## 2016-05-13 DIAGNOSIS — Z0000 Encounter for general adult medical examination without abnormal findings: Secondary | ICD-10-CM

## 2016-05-13 DIAGNOSIS — Z1231 Encounter for screening mammogram for malignant neoplasm of breast: Secondary | ICD-10-CM

## 2016-05-13 DIAGNOSIS — Z1239 Encounter for other screening for malignant neoplasm of breast: Secondary | ICD-10-CM

## 2016-05-13 MED ORDER — ESTRADIOL 0.025 MG/24HR TD PTTW: 1.0000 | MEDICATED_PATCH | TRANSDERMAL | 11 refills | Status: DC

## 2016-05-13 NOTE — Patient Instructions (Addendum)
1. No Pap smear needed 2. Mammogram ordered 3. Referral to Dr. Scarlette Shorts in [place] for colonoscopy 4. Continue with healthy eating, exercise, and control weight loss at 1 pound per month 5. Recommend continue calcium with vitamin D supplementation 6. Recommend increased fiber and stool softeners and diet to help with chronic constipation component of IBS 7. Return in 1 year for annual exam    Health Maintenance for Postmenopausal Women Menopause is a normal process in which your reproductive ability comes to an end. This process happens gradually over a span of months to years, usually between the ages of 29 and 44. Menopause is complete when you have missed 12 consecutive menstrual periods. It is important to talk with your health care provider about some of the most common conditions that affect postmenopausal women, such as heart disease, cancer, and bone loss (osteoporosis). Adopting a healthy lifestyle and getting preventive care can help to promote your health and wellness. Those actions can also lower your chances of developing some of these common conditions. What should I know about menopause? During menopause, you may experience a number of symptoms, such as:  Moderate-to-severe hot flashes.  Night sweats.  Decrease in sex drive.  Mood swings.  Headaches.  Tiredness.  Irritability.  Memory problems.  Insomnia. Choosing to treat or not to treat menopausal changes is an individual decision that you make with your health care provider. What should I know about hormone replacement therapy and supplements? Hormone therapy products are effective for treating symptoms that are associated with menopause, such as hot flashes and night sweats. Hormone replacement carries certain risks, especially as you become older. If you are thinking about using estrogen or estrogen with progestin treatments, discuss the benefits and risks with your health care provider. What should I know  about heart disease and stroke? Heart disease, heart attack, and stroke become more likely as you age. This may be due, in part, to the hormonal changes that your body experiences during menopause. These can affect how your body processes dietary fats, triglycerides, and cholesterol. Heart attack and stroke are both medical emergencies. There are many things that you can do to help prevent heart disease and stroke:  Have your blood pressure checked at least every 1-2 years. High blood pressure causes heart disease and increases the risk of stroke.  If you are 60-52 years old, ask your health care provider if you should take aspirin to prevent a heart attack or a stroke.  Do not use any tobacco products, including cigarettes, chewing tobacco, or electronic cigarettes. If you need help quitting, ask your health care provider.  It is important to eat a healthy diet and maintain a healthy weight.  Be sure to include plenty of vegetables, fruits, low-fat dairy products, and lean protein.  Avoid eating foods that are high in solid fats, added sugars, or salt (sodium).  Get regular exercise. This is one of the most important things that you can do for your health.  Try to exercise for at least 150 minutes each week. The type of exercise that you do should increase your heart rate and make you sweat. This is known as moderate-intensity exercise.  Try to do strengthening exercises at least twice each week. Do these in addition to the moderate-intensity exercise.  Know your numbers.Ask your health care provider to check your cholesterol and your blood glucose. Continue to have your blood tested as directed by your health care provider. What should I know about cancer screening?  There are several types of cancer. Take the following steps to reduce your risk and to catch any cancer development as early as possible. Breast Cancer  Practice breast self-awareness.  This means understanding how your  breasts normally appear and feel.  It also means doing regular breast self-exams. Let your health care provider know about any changes, no matter how small.  If you are 56 or older, have a clinician do a breast exam (clinical breast exam or CBE) every year. Depending on your age, family history, and medical history, it may be recommended that you also have a yearly breast X-ray (mammogram).  If you have a family history of breast cancer, talk with your health care provider about genetic screening.  If you are at high risk for breast cancer, talk with your health care provider about having an MRI and a mammogram every year.  Breast cancer (BRCA) gene test is recommended for women who have family members with BRCA-related cancers. Results of the assessment will determine the need for genetic counseling and BRCA1 and for BRCA2 testing. BRCA-related cancers include these types:  Breast. This occurs in males or females.  Ovarian.  Tubal. This may also be called fallopian tube cancer.  Cancer of the abdominal or pelvic lining (peritoneal cancer).  Prostate.  Pancreatic. Cervical, Uterine, and Ovarian Cancer  Your health care provider may recommend that you be screened regularly for cancer of the pelvic organs. These include your ovaries, uterus, and vagina. This screening involves a pelvic exam, which includes checking for microscopic changes to the surface of your cervix (Pap test).  For women ages 21-65, health care providers may recommend a pelvic exam and a Pap test every three years. For women ages 43-65, they may recommend the Pap test and pelvic exam, combined with testing for human papilloma virus (HPV), every five years. Some types of HPV increase your risk of cervical cancer. Testing for HPV may also be done on women of any age who have unclear Pap test results.  Other health care providers may not recommend any screening for nonpregnant women who are considered low risk for pelvic  cancer and have no symptoms. Ask your health care provider if a screening pelvic exam is right for you.  If you have had past treatment for cervical cancer or a condition that could lead to cancer, you need Pap tests and screening for cancer for at least 20 years after your treatment. If Pap tests have been discontinued for you, your risk factors (such as having a new sexual partner) need to be reassessed to determine if you should start having screenings again. Some women have medical problems that increase the chance of getting cervical cancer. In these cases, your health care provider may recommend that you have screening and Pap tests more often.  If you have a family history of uterine cancer or ovarian cancer, talk with your health care provider about genetic screening.  If you have vaginal bleeding after reaching menopause, tell your health care provider.  There are currently no reliable tests available to screen for ovarian cancer. Lung Cancer  Lung cancer screening is recommended for adults 67-71 years old who are at high risk for lung cancer because of a history of smoking. A yearly low-dose CT scan of the lungs is recommended if you:  Currently smoke.  Have a history of at least 30 pack-years of smoking and you currently smoke or have quit within the past 15 years. A pack-year is smoking an  average of one pack of cigarettes per day for one year. Yearly screening should:  Continue until it has been 15 years since you quit.  Stop if you develop a health problem that would prevent you from having lung cancer treatment. Colorectal Cancer  This type of cancer can be detected and can often be prevented.  Routine colorectal cancer screening usually begins at age 56 and continues through age 63.  If you have risk factors for colon cancer, your health care provider may recommend that you be screened at an earlier age.  If you have a family history of colorectal cancer, talk with your  health care provider about genetic screening.  Your health care provider may also recommend using home test kits to check for hidden blood in your stool.  A small camera at the end of a tube can be used to examine your colon directly (sigmoidoscopy or colonoscopy). This is done to check for the earliest forms of colorectal cancer.  Direct examination of the colon should be repeated every 5-10 years until age 69. However, if early forms of precancerous polyps or small growths are found or if you have a family history or genetic risk for colorectal cancer, you may need to be screened more often. Skin Cancer  Check your skin from head to toe regularly.  Monitor any moles. Be sure to tell your health care provider:  About any new moles or changes in moles, especially if there is a change in a mole's shape or color.  If you have a mole that is larger than the size of a pencil eraser.  If any of your family members has a history of skin cancer, especially at a young age, talk with your health care provider about genetic screening.  Always use sunscreen. Apply sunscreen liberally and repeatedly throughout the day.  Whenever you are outside, protect yourself by wearing long sleeves, pants, a wide-brimmed hat, and sunglasses. What should I know about osteoporosis? Osteoporosis is a condition in which bone destruction happens more quickly than new bone creation. After menopause, you may be at an increased risk for osteoporosis. To help prevent osteoporosis or the bone fractures that can happen because of osteoporosis, the following is recommended:  If you are 65-25 years old, get at least 1,000 mg of calcium and at least 600 mg of vitamin D per day.  If you are older than age 74 but younger than age 29, get at least 1,200 mg of calcium and at least 600 mg of vitamin D per day.  If you are older than age 14, get at least 1,200 mg of calcium and at least 800 mg of vitamin D per day. Smoking and  excessive alcohol intake increase the risk of osteoporosis. Eat foods that are rich in calcium and vitamin D, and do weight-bearing exercises several times each week as directed by your health care provider. What should I know about how menopause affects my mental health? Depression may occur at any age, but it is more common as you become older. Common symptoms of depression include:  Low or sad mood.  Changes in sleep patterns.  Changes in appetite or eating patterns.  Feeling an overall lack of motivation or enjoyment of activities that you previously enjoyed.  Frequent crying spells. Talk with your health care provider if you think that you are experiencing depression. What should I know about immunizations? It is important that you get and maintain your immunizations. These include:  Tetanus, diphtheria, and  pertussis (Tdap) booster vaccine.  Influenza every year before the flu season begins.  Pneumonia vaccine.  Shingles vaccine. Your health care provider may also recommend other immunizations. This information is not intended to replace advice given to you by your health care provider. Make sure you discuss any questions you have with your health care provider. Document Released: 04/17/2005 Document Revised: 09/13/2015 Document Reviewed: 11/27/2014 Elsevier Interactive Patient Education  2017 [employer].

## 2016-05-22 ENCOUNTER — Telehealth: Payer: Self-pay | Admitting: Internal Medicine

## 2016-05-22 NOTE — Telephone Encounter (Signed)
Pt is requesting Dr.Perry review her records. Records have been placed on Dr.Perry's desk for review.

## 2016-06-01 NOTE — Telephone Encounter (Signed)
Records reviewed by Dr. Marina GoodellPerry and okay to schedule colon in [facility].  Records in records reviewed folder

## 2016-06-02 ENCOUNTER — Encounter: Payer: Self-pay | Admitting: Internal Medicine

## 2016-06-17 ENCOUNTER — Ambulatory Visit
Admission: RE | Admit: 2016-06-17 | Discharge: 2016-06-17 | Disposition: A | Discharge status: Home / Self Care | Payer: BC Managed Care – PPO | Source: Ambulatory Visit | Attending: Obstetrics and Gynecology | Admitting: Obstetrics and Gynecology

## 2016-06-17 ENCOUNTER — Other Ambulatory Visit: Payer: Self-pay | Admitting: Obstetrics and Gynecology

## 2016-06-17 DIAGNOSIS — Z1231 Encounter for screening mammogram for malignant neoplasm of breast: Secondary | ICD-10-CM | POA: Insufficient documentation

## 2016-06-17 DIAGNOSIS — Z1239 Encounter for other screening for malignant neoplasm of breast: Secondary | ICD-10-CM

## 2016-08-07 NOTE — Telephone Encounter (Signed)
Patient cancelled appointment on 08/14/16 states she will be going to another Gi office closer to her.

## 2016-08-17 ENCOUNTER — Other Ambulatory Visit: Payer: Self-pay | Admitting: Obstetrics and Gynecology

## 2016-08-28 ENCOUNTER — Encounter: Payer: BC Managed Care – PPO | Admitting: Internal Medicine

## 2016-10-02 ENCOUNTER — Encounter: Payer: BC Managed Care – PPO | Admitting: Internal Medicine

## 2017-05-14 ENCOUNTER — Other Ambulatory Visit: Payer: Self-pay | Admitting: Obstetrics and Gynecology

## 2017-07-18 ENCOUNTER — Encounter: Payer: Self-pay | Admitting: Obstetrics and Gynecology

## 2017-08-24 NOTE — Progress Notes (Signed)
Patient ID: Virginia Oconnor, female   DOB: Feb 12, 1966, 52 y.o.   MRN: [id number] ANNUAL PREVENTATIVE CARE GYN  ENCOUNTER NOTE  Subjective:       Virginia Oconnor is a 52 y.o. [phone] female here for a routine annual gynecologic exam.  Current complaints:  1.  None  Wt down 10#; patient is using Contrave; this medication is helping MS type symptoms  Patient is being followed by Dr. Earmon Phoenix for possible early MS Bowel function is controlled with Linzess.  Father was recently diagnosed with colon cancer; father's brother also had colon cancer  She is on Synthroid for hypothyroidism.    Virginia Oconnor is not experiencing any significant vasomotor symptoms while on Vivelle-Dot 0.025 mg patch twice weekly. She is taking calcium and vitamin D supplementation   Gynecologic History No LMP recorded. Patient has had a hysterectomy. Contraception: status post hysterectomyTransvaginal hysterectomy Last Pap: 10/2012 n/n. Results were: normal Last mammogram: 06/18/2016 birad 1. Results were: normal  Obstetric History OB History  Gravida Para Term Preterm AB Living  5 1 1   4 1   SAB TAB Ectopic Multiple Live Births  4       1    # Outcome Date GA Lbr Len/2nd Weight Sex Delivery Anes PTL Lv  5 SAB 2002          4 SAB 1996          3 SAB 1996          2 Term 1995     Vag-Spont   LIV  1 SAB 1994            Past Medical History:  Diagnosis Date  . Adenomyosis   . Anxiety   . Cervical dysplasia   . Chronic constipation   . Depression   . Dysmenorrhea    h/o  . IBS (irritable bowel syndrome)   . Internal hemorrhoid   . Menopause   . Migraines   . Thyroid disease    hypo  . TMJ (dislocation of temporomandibular joint)   . Vaginal dryness     Past Surgical History:  Procedure Laterality Date  . CARPAL TUNNEL RELEASE    . CERVICAL CERCLAGE    . CERVICAL CONIZATION W/BX    . HEMORRHOID BANDING    . LEEP      Current Outpatient Medications on File Prior to Visit   Medication Sig Dispense Refill  . estradiol (ESTRACE) 0.1 MG/GM vaginal cream Place 1 Applicatorful vaginally at bedtime. 42.5 g 6  . estradiol (VIVELLE-DOT) 0.025 MG/24HR PLACE 1 PATCH ONTO THE SKIN TWICE A WEEK 8 patch 11  . estradiol (VIVELLE-DOT) 0.025 MG/24HR PLACE 1 PATCH ONTO THE SKIN TWICE A WEEK 24 patch 0  . furosemide (LASIX) 20 MG tablet Take as needed as directed    . hydrocortisone-pramoxine (PROCTOFOAM HC) rectal foam Place 1 applicator rectally 2 (two) times daily. 10 g 3  . levothyroxine (SYNTHROID, LEVOTHROID) 50 MCG tablet Take by mouth.    Marland Kitchen Linaclotide (LINZESS) 290 MCG CAPS capsule Take by mouth.    . Vitamin D, Ergocalciferol, (DRISDOL) 50000 units CAPS capsule Take 50,000 Units by mouth every 7 (seven) days.     No current facility-administered medications on file prior to visit.     No Known Allergies  Social History   Socioeconomic History  . Marital status: Married    Spouse name: Not on file  . Number of children: Not on file  . Years of education: Not  on file  . Highest education level: Not on file  Occupational History  . Not on file  Social Needs  . Financial resource strain: Not on file  . Food insecurity:    Worry: Not on file    Inability: Not on file  . Transportation needs:    Medical: Not on file    Non-medical: Not on file  Tobacco Use  . Smoking status: Never Smoker  . Smokeless tobacco: Never Used  Substance and Sexual Activity  . Alcohol use: Yes    Comment: occas  . Drug use: No  . Sexual activity: Yes    Birth control/protection: Surgical  Lifestyle  . Physical activity:    Days per week: Not on file    Minutes per session: Not on file  . Stress: Not on file  Relationships  . Social connections:    Talks on phone: Not on file    Gets together: Not on file    Attends religious service: Not on file    Active member of club or organization: Not on file    Attends meetings of clubs or organizations: Not on file     Relationship status: Not on file  . Intimate partner violence:    Fear of current or ex partner: Not on file    Emotionally abused: Not on file    Physically abused: Not on file    Forced sexual activity: Not on file  Other Topics Concern  . Not on file  Social History Narrative  . Not on file    Family History  Problem Relation Age of Onset  . Hodgkin's lymphoma Mother   . Colon cancer Maternal Uncle   . Breast cancer Paternal Aunt        great Aunts x 3  . Colon cancer Paternal Uncle   . Prostate cancer Paternal Uncle   . Lung cancer Paternal Grandfather   . Prostate cancer Paternal Grandfather   . Prostate cancer Father   . Cirrhosis Father   . Diabetes Neg Hx   . Heart disease Neg Hx   . Ovarian cancer Neg Hx     The following portions of the patient's history were reviewed and updated as appropriate: allergies, current medications, past family history, past medical history, past social history, past surgical history and problem list.  Review of Systems   Review of Systems  Constitutional: Negative.   HENT: Negative.   Eyes: Negative.   Respiratory: Negative.   Cardiovascular: Negative.   Genitourinary: Negative.   Musculoskeletal: Negative.   Skin: Negative.   Neurological: Negative.   Endo/Heme/Allergies: Negative.   Psychiatric/Behavioral: Negative.     Objective:   BP 130/70   Pulse 83   Ht 5\' 5"  (1.651 m)   Wt 177 lb 6.4 oz (80.5 kg)   BMI 29.52 kg/m   CONSTITUTIONAL: Well-developed, well-nourished female in no acute distress.  PSYCHIATRIC: Normal mood and affect. Normal behavior. Normal judgment and thought content. NEUROLGIC: Alert and oriented to person, place, and time. Normal muscle tone coordination. No cranial nerve deficit noted. HENT:  Normocephalic, atraumatic, External right and left ear normal.  NECK: Normal range of motion, supple, no masses.  Normal thyroid.  SKIN: Skin is warm and dry. No rash noted. Not diaphoretic. No erythema. No  pallor. CARDIOVASCULAR: Normal heart rate noted, regular rhythm, no murmur. RESPIRATORY: Clear to auscultation bilaterally. Effort and breath sounds normal, no problems with respiration noted. BREASTS: Symmetric in size. No masses, skin changes, nipple drainage,  or lymphadenopathy. ABDOMEN: Soft, no distention noted.  No tenderness, rebound or guarding.  BLADDER: Normal PELVIC: (No significant changes)  External Genitalia: Normal  BUS: Normal  Vagina: Normal; Good vault support; good estrogen effect; no lesions  Cervix: Surgically absent  Uterus: Surgically absent  Adnexa: Normal; No masses, no tenderness  RV: external hemorrhoid, External Exam NormaI, No Rectal Masses and Normal Sphincter tone  MUSCULOSKELETAL: Normal range of motion. No tenderness.  No cyanosis, clubbing, or edema.  2+ distal pulses. LYMPHATIC: No Axillary, Supraclavicular, or Inguinal Adenopathy.    Assessment:   Annual gynecologic examination 52 y.o. Contraception: status post hysterectomyTVH History of adenomyosis History of abnormal Pap smear; last Pap smear 2014-negative/negative; no further paps necessary unless new lesion is identified BMI-29; working on weight loss Chronic constipation; small rectocele, minimally symptomatic Family history of colon cancer  Plan:  Pap: Not needed Mammogram: Ordered Stool Guaiac Testing:  Colonoscopy-01/2017 polyps removed- neg- repeat in 5 years Labs: pcp Routine preventative health maintenance measures emphasized: Exercise/Diet/Weight control, Tobacco Warnings and Alcohol/Substance use risks  Literature regarding genetic testing for cancer syndromes is given.  Darol Destinerystal Miller, CMA  Herold HarmsMartin A Alfonso Shackett, MD   Note: This dictation was prepared with Dragon dictation along with smaller phrase technology. Any transcriptional errors that result from this process are unintentional.

## 2017-08-31 ENCOUNTER — Encounter: Payer: Self-pay | Admitting: Obstetrics and Gynecology

## 2017-08-31 ENCOUNTER — Ambulatory Visit (INDEPENDENT_AMBULATORY_CARE_PROVIDER_SITE_OTHER): Payer: BC Managed Care – PPO | Admitting: Obstetrics and Gynecology

## 2017-08-31 VITALS — BP 130/70 | HR 83 | Ht 65.0 in | Wt 177.4 lb

## 2017-08-31 DIAGNOSIS — Z90710 Acquired absence of both cervix and uterus: Secondary | ICD-10-CM | POA: Diagnosis not present

## 2017-08-31 DIAGNOSIS — R638 Other symptoms and signs concerning food and fluid intake: Secondary | ICD-10-CM

## 2017-08-31 DIAGNOSIS — Z800 Family history of malignant neoplasm of digestive organs: Secondary | ICD-10-CM | POA: Diagnosis not present

## 2017-08-31 DIAGNOSIS — Z1211 Encounter for screening for malignant neoplasm of colon: Secondary | ICD-10-CM

## 2017-08-31 DIAGNOSIS — Z0000 Encounter for general adult medical examination without abnormal findings: Secondary | ICD-10-CM

## 2017-08-31 DIAGNOSIS — Z1231 Encounter for screening mammogram for malignant neoplasm of breast: Secondary | ICD-10-CM

## 2017-08-31 DIAGNOSIS — Z1239 Encounter for other screening for malignant neoplasm of breast: Secondary | ICD-10-CM

## 2017-08-31 DIAGNOSIS — N951 Menopausal and female climacteric states: Secondary | ICD-10-CM

## 2017-08-31 MED ORDER — ESTRADIOL 0.025 MG/24HR TD PTTW: 1.0000 | MEDICATED_PATCH | TRANSDERMAL | 3 refills | Status: DC

## 2017-08-31 NOTE — Patient Instructions (Addendum)
1.  Pap smear is not done.  No further Paps are needed 2.  Mammogram is ordered 3.  Screening colonoscopy already obtained in 8.  Literature regarding genetic testing for cancer syndromes is given November 2018 4.  Screening labs are to be obtained through primary care 5.  Continue with healthy eating and exercise with weight loss 6.  Recommend calcium vitamin D supplementation twice a day 7.  Return in 1 year for annual exam 8.  Literature regarding genetic testing for cancer syndromes is given   Health Maintenance for Postmenopausal Women Menopause is a normal process in which your reproductive ability comes to an end. This process happens gradually over a span of months to years, usually between the ages of 47 and 63. Menopause is complete when you have missed 12 consecutive menstrual periods. It is important to talk with your health care provider about some of the most common conditions that affect postmenopausal women, such as heart disease, cancer, and bone loss (osteoporosis). Adopting a healthy lifestyle and getting preventive care can help to promote your health and wellness. Those actions can also lower your chances of developing some of these common conditions. What should I know about menopause? During menopause, you may experience a number of symptoms, such as:  Moderate-to-severe hot flashes.  Night sweats.  Decrease in sex drive.  Mood swings.  Headaches.  Tiredness.  Irritability.  Memory problems.  Insomnia.  Choosing to treat or not to treat menopausal changes is an individual decision that you make with your health care provider. What should I know about hormone replacement therapy and supplements? Hormone therapy products are effective for treating symptoms that are associated with menopause, such as hot flashes and night sweats. Hormone replacement carries certain risks, especially as you become older. If you are thinking about using estrogen or estrogen with  progestin treatments, discuss the benefits and risks with your health care provider. What should I know about heart disease and stroke? Heart disease, heart attack, and stroke become more likely as you age. This may be due, in part, to the hormonal changes that your body experiences during menopause. These can affect how your body processes dietary fats, triglycerides, and cholesterol. Heart attack and stroke are both medical emergencies. There are many things that you can do to help prevent heart disease and stroke:  Have your blood pressure checked at least every 1-2 years. High blood pressure causes heart disease and increases the risk of stroke.  If you are 46-34 years old, ask your health care provider if you should take aspirin to prevent a heart attack or a stroke.  Do not use any tobacco products, including cigarettes, chewing tobacco, or electronic cigarettes. If you need help quitting, ask your health care provider.  It is important to eat a healthy diet and maintain a healthy weight. ? Be sure to include plenty of vegetables, fruits, low-fat dairy products, and lean protein. ? Avoid eating foods that are high in solid fats, added sugars, or salt (sodium).  Get regular exercise. This is one of the most important things that you can do for your health. ? Try to exercise for at least 150 minutes each week. The type of exercise that you do should increase your heart rate and make you sweat. This is known as moderate-intensity exercise. ? Try to do strengthening exercises at least twice each week. Do these in addition to the moderate-intensity exercise.  Know your numbers.Ask your health care provider to check your cholesterol  and your blood glucose. Continue to have your blood tested as directed by your health care provider.  What should I know about cancer screening? There are several types of cancer. Take the following steps to reduce your risk and to catch any cancer development as  early as possible. Breast Cancer  Practice breast self-awareness. ? This means understanding how your breasts normally appear and feel. ? It also means doing regular breast self-exams. Let your health care provider know about any changes, no matter how small.  If you are 47 or older, have a clinician do a breast exam (clinical breast exam or CBE) every year. Depending on your age, family history, and medical history, it may be recommended that you also have a yearly breast X-ray (mammogram).  If you have a family history of breast cancer, talk with your health care provider about genetic screening.  If you are at high risk for breast cancer, talk with your health care provider about having an MRI and a mammogram every year.  Breast cancer (BRCA) gene test is recommended for women who have family members with BRCA-related cancers. Results of the assessment will determine the need for genetic counseling and BRCA1 and for BRCA2 testing. BRCA-related cancers include these types: ? Breast. This occurs in males or females. ? Ovarian. ? Tubal. This may also be called fallopian tube cancer. ? Cancer of the abdominal or pelvic lining (peritoneal cancer). ? Prostate. ? Pancreatic.  Cervical, Uterine, and Ovarian Cancer Your health care provider may recommend that you be screened regularly for cancer of the pelvic organs. These include your ovaries, uterus, and vagina. This screening involves a pelvic exam, which includes checking for microscopic changes to the surface of your cervix (Pap test).  For women ages 21-65, health care providers may recommend a pelvic exam and a Pap test every three years. For women ages 78-65, they may recommend the Pap test and pelvic exam, combined with testing for human papilloma virus (HPV), every five years. Some types of HPV increase your risk of cervical cancer. Testing for HPV may also be done on women of any age who have unclear Pap test results.  Other health  care providers may not recommend any screening for nonpregnant women who are considered low risk for pelvic cancer and have no symptoms. Ask your health care provider if a screening pelvic exam is right for you.  If you have had past treatment for cervical cancer or a condition that could lead to cancer, you need Pap tests and screening for cancer for at least 20 years after your treatment. If Pap tests have been discontinued for you, your risk factors (such as having a new sexual partner) need to be reassessed to determine if you should start having screenings again. Some women have medical problems that increase the chance of getting cervical cancer. In these cases, your health care provider may recommend that you have screening and Pap tests more often.  If you have a family history of uterine cancer or ovarian cancer, talk with your health care provider about genetic screening.  If you have vaginal bleeding after reaching menopause, tell your health care provider.  There are currently no reliable tests available to screen for ovarian cancer.  Lung Cancer Lung cancer screening is recommended for adults 17-2 years old who are at high risk for lung cancer because of a history of smoking. A yearly low-dose CT scan of the lungs is recommended if you:  Currently smoke.  Have a  history of at least 30 pack-years of smoking and you currently smoke or have quit within the past 15 years. A pack-year is smoking an average of one pack of cigarettes per day for one year.  Yearly screening should:  Continue until it has been 15 years since you quit.  Stop if you develop a health problem that would prevent you from having lung cancer treatment.  Colorectal Cancer  This type of cancer can be detected and can often be prevented.  Routine colorectal cancer screening usually begins at age 70 and continues through age 106.  If you have risk factors for colon cancer, your health care provider may  recommend that you be screened at an earlier age.  If you have a family history of colorectal cancer, talk with your health care provider about genetic screening.  Your health care provider may also recommend using home test kits to check for hidden blood in your stool.  A small camera at the end of a tube can be used to examine your colon directly (sigmoidoscopy or colonoscopy). This is done to check for the earliest forms of colorectal cancer.  Direct examination of the colon should be repeated every 5-10 years until age 5. However, if early forms of precancerous polyps or small growths are found or if you have a family history or genetic risk for colorectal cancer, you may need to be screened more often.  Skin Cancer  Check your skin from head to toe regularly.  Monitor any moles. Be sure to tell your health care provider: ? About any new moles or changes in moles, especially if there is a change in a mole's shape or color. ? If you have a mole that is larger than the size of a pencil eraser.  If any of your family members has a history of skin cancer, especially at a young age, talk with your health care provider about genetic screening.  Always use sunscreen. Apply sunscreen liberally and repeatedly throughout the day.  Whenever you are outside, protect yourself by wearing long sleeves, pants, a wide-brimmed hat, and sunglasses.  What should I know about osteoporosis? Osteoporosis is a condition in which bone destruction happens more quickly than new bone creation. After menopause, you may be at an increased risk for osteoporosis. To help prevent osteoporosis or the bone fractures that can happen because of osteoporosis, the following is recommended:  If you are 60-19 years old, get at least 1,000 mg of calcium and at least 600 mg of vitamin D per day.  If you are older than age 47 but younger than age 58, get at least 1,200 mg of calcium and at least 600 mg of vitamin D per  day.  If you are older than age 21, get at least 1,200 mg of calcium and at least 800 mg of vitamin D per day.  Smoking and excessive alcohol intake increase the risk of osteoporosis. Eat foods that are rich in calcium and vitamin D, and do weight-bearing exercises several times each week as directed by your health care provider. What should I know about how menopause affects my mental health? Depression may occur at any age, but it is more common as you become older. Common symptoms of depression include:  Low or sad mood.  Changes in sleep patterns.  Changes in appetite or eating patterns.  Feeling an overall lack of motivation or enjoyment of activities that you previously enjoyed.  Frequent crying spells.  Talk with your health care  provider if you think that you are experiencing depression. What should I know about immunizations? It is important that you get and maintain your immunizations. These include:  Tetanus, diphtheria, and pertussis (Tdap) booster vaccine.  Influenza every year before the flu season begins.  Pneumonia vaccine.  Shingles vaccine.  Your health care provider may also recommend other immunizations. This information is not intended to replace advice given to you by your health care provider. Make sure you discuss any questions you have with your health care provider. Document Released: 04/17/2005 Document Revised: 09/13/2015 Document Reviewed: 11/27/2014 Elsevier Interactive Patient Education  2018 [employer]  

## 2017-09-02 ENCOUNTER — Encounter: Payer: BC Managed Care – PPO | Admitting: Obstetrics and Gynecology

## 2017-09-07 ENCOUNTER — Other Ambulatory Visit: Payer: BC Managed Care – PPO

## 2017-09-23 ENCOUNTER — Telehealth: Payer: Self-pay

## 2017-09-23 NOTE — Telephone Encounter (Signed)
Pt aware negative [facility] results.

## 2017-11-17 ENCOUNTER — Ambulatory Visit
Admission: RE | Admit: 2017-11-17 | Discharge: 2017-11-17 | Disposition: A | Discharge status: Home / Self Care | Payer: BC Managed Care – PPO | Source: Ambulatory Visit | Attending: Obstetrics and Gynecology | Admitting: Obstetrics and Gynecology

## 2017-11-17 DIAGNOSIS — Z1231 Encounter for screening mammogram for malignant neoplasm of breast: Secondary | ICD-10-CM | POA: Insufficient documentation

## 2017-11-17 DIAGNOSIS — Z1239 Encounter for other screening for malignant neoplasm of breast: Secondary | ICD-10-CM

## 2018-09-06 ENCOUNTER — Encounter: Payer: BC Managed Care – PPO | Admitting: Obstetrics and Gynecology

## 2018-11-01 ENCOUNTER — Other Ambulatory Visit (HOSPITAL_COMMUNITY): Payer: Self-pay | Admitting: Neurology

## 2018-11-01 DIAGNOSIS — R29898 Other symptoms and signs involving the musculoskeletal system: Secondary | ICD-10-CM

## 2018-11-01 DIAGNOSIS — R4189 Other symptoms and signs involving cognitive functions and awareness: Secondary | ICD-10-CM

## 2018-11-11 ENCOUNTER — Encounter: Payer: Self-pay | Admitting: Obstetrics and Gynecology

## 2018-11-11 ENCOUNTER — Other Ambulatory Visit: Payer: Self-pay

## 2018-11-11 ENCOUNTER — Ambulatory Visit (INDEPENDENT_AMBULATORY_CARE_PROVIDER_SITE_OTHER): Payer: BC Managed Care – PPO | Admitting: Obstetrics and Gynecology

## 2018-11-11 VITALS — BP 112/65 | HR 78 | Ht 64.5 in | Wt 180.2 lb

## 2018-11-11 DIAGNOSIS — Z01419 Encounter for gynecological examination (general) (routine) without abnormal findings: Secondary | ICD-10-CM | POA: Diagnosis not present

## 2018-11-11 MED ORDER — ESTRADIOL 0.025 MG/24HR TD PTTW: 1.0000 | MEDICATED_PATCH | TRANSDERMAL | 3 refills | Status: AC

## 2018-11-11 NOTE — Progress Notes (Signed)
Subjective:   Virginia Oconnor is a 53 y.o. [phone] Caucasian female here for a routine well-woman exam.  No LMP recorded. Patient has had a hysterectomy.    Current complaints: seeing Dr Manuella Ghazi for neurological workup.  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 Tobacco/alcohol: no tobacco use Illicit drugs: no history of illicit drug use  The following portions of the patient's history were reviewed and updated as appropriate: allergies, current medications, past family history, past medical history, past social history, past surgical history and problem list.  Past Medical History Past Medical History:  Diagnosis Date  . Adenomyosis   . Anxiety   . B12 deficiency   . Cervical dysplasia   . Chronic constipation   . Depression   . Dysmenorrhea    h/o  . Hemochromatosis   . IBS (irritable bowel syndrome)   . Internal hemorrhoid   . Menopause   . Migraines   . Thyroid disease    hypo  . TMJ (dislocation of temporomandibular joint)   . Vaginal dryness     Past Surgical History Past Surgical History:  Procedure Laterality Date  . CARPAL TUNNEL RELEASE    . CERVICAL CERCLAGE    . CERVICAL CONIZATION W/BX    . HEMORRHOID BANDING    . LEEP      Gynecologic History [id number]  No LMP recorded. Patient has had a hysterectomy. Contraception: status post hysterectomy Last Pap: 2017. Results were: normal Last mammogram: [id number]. Results were: normal  Obstetric History OB History  Gravida Para Term Preterm AB Living  5 1 1   4 1   SAB TAB Ectopic Multiple Live Births  4       1    # Outcome Date GA Lbr Len/2nd Weight Sex Delivery Anes PTL Lv  5 SAB 2002          4 SAB 1996          3 SAB 1996          2 Term 1995     Vag-Spont   LIV  1 SAB 1994            Current Medications Current Outpatient Medications on File  Prior to Visit  Medication Sig Dispense Refill  . buPROPion (WELLBUTRIN) 75 MG tablet Take 75 mg by mouth 2 (two) times daily.    . Cyanocobalamin (B-12) 5000 MCG SUBL Place under the tongue.    Marland Kitchen estradiol (VIVELLE-DOT) 0.025 MG/24HR Place 1 patch onto the skin 2 (two) times a week. 24 patch 3  . levothyroxine (SYNTHROID, LEVOTHROID) 50 MCG tablet Take by mouth.    Marland Kitchen Linaclotide (LINZESS) 290 MCG CAPS capsule Take by mouth.    . spironolactone (ALDACTONE) 50 MG tablet Take 50 mg by mouth daily.    Marland Kitchen tretinoin (RETIN-A) 0.025 % cream Apply topically at bedtime.    . valACYclovir (VALTREX) 1000 MG tablet Take 1,000 mg by mouth 2 (two) times daily.    . Vitamin D, Ergocalciferol, (DRISDOL) 50000 units CAPS capsule Take 50,000 Units by mouth every 7 (seven) days.    . CONTRAVE 8-90 MG TB12 Take 1 tablet by mouth 2 (two) times daily.  0  . estradiol (ESTRACE) 0.1 MG/GM vaginal cream Place 1 Applicatorful vaginally at bedtime. (Patient not taking: Reported on 11/11/2018) 42.5 g 6  . furosemide (LASIX) 20  MG tablet Take as needed as directed     No current facility-administered medications on file prior to visit.     Review of Systems Patient denies any headaches, blurred vision, shortness of breath, chest pain, abdominal pain, problems with bowel movements, urination, or intercourse.  Objective:  BP 112/65   Pulse 78   Ht 5' 4.5" (1.638 m)   Wt 180 lb 3.2 oz (81.7 kg)   BMI 30.45 kg/m  Physical Exam  General:  Well developed, well nourished, no acute distress. She is alert and oriented x3. Skin:  Warm and dry Neck:  Midline trachea, no thyromegaly or nodules Cardiovascular: Regular rate and rhythm, no murmur heard Lungs:  Effort normal, all lung fields clear to auscultation bilaterally Breasts:  No dominant palpable mass, retraction, or nipple discharge Abdomen:  Soft, non tender, no hepatosplenomegaly or masses Pelvic:  External genitalia is normal in appearance.  The vagina is normal  in appearance. The cervix is surgically absent.  Thin prep pap is not done . Uterus is surgically absent.  No adnexal masses or tenderness noted. Extremities:  No swelling or varicosities noted Psych:  She has a normal mood and affect  Assessment:   Healthy well-woman exam BMI 30 ERT  Plan:  meds refilled Uberlube sample given F/U 1 year for AE, or sooner if needed Mammogram ordered or sooner if problems Colonoscopy up to date  Melody Suzan NailerN Shambley, CNM

## 2018-11-12 ENCOUNTER — Ambulatory Visit (HOSPITAL_COMMUNITY): Payer: BC Managed Care – PPO

## 2018-11-26 ENCOUNTER — Encounter (HOSPITAL_COMMUNITY): Payer: Self-pay

## 2018-11-26 ENCOUNTER — Ambulatory Visit (HOSPITAL_COMMUNITY): Admission: RE | Admit: 2018-11-26 | Payer: BC Managed Care – PPO | Source: Ambulatory Visit

## 2018-11-26 ENCOUNTER — Ambulatory Visit (HOSPITAL_COMMUNITY): Payer: BC Managed Care – PPO

## 2018-12-27 ENCOUNTER — Ambulatory Visit (HOSPITAL_COMMUNITY)
Admission: RE | Admit: 2018-12-27 | Discharge: 2018-12-27 | Disposition: A | Discharge status: Home / Self Care | Payer: BC Managed Care – PPO | Source: Ambulatory Visit | Attending: Neurology | Admitting: Neurology

## 2018-12-27 ENCOUNTER — Other Ambulatory Visit: Payer: Self-pay

## 2018-12-27 DIAGNOSIS — R4189 Other symptoms and signs involving cognitive functions and awareness: Secondary | ICD-10-CM | POA: Insufficient documentation

## 2018-12-27 DIAGNOSIS — R29898 Other symptoms and signs involving the musculoskeletal system: Secondary | ICD-10-CM

## 2018-12-27 MED ORDER — GADOBUTROL 1 MMOL/ML IV SOLN
8.0000 mL | Freq: Once | INTRAVENOUS | Status: AC | PRN
  Administered 2018-12-27: 8 mL via INTRAVENOUS

## 2019-03-23 ENCOUNTER — Ambulatory Visit
Admission: RE | Admit: 2019-03-23 | Discharge: 2019-03-23 | Disposition: A | Discharge status: Home / Self Care | Payer: BC Managed Care – PPO | Source: Ambulatory Visit | Attending: Obstetrics and Gynecology | Admitting: Obstetrics and Gynecology

## 2019-03-23 DIAGNOSIS — Z1231 Encounter for screening mammogram for malignant neoplasm of breast: Secondary | ICD-10-CM | POA: Insufficient documentation

## 2019-03-23 DIAGNOSIS — Z01419 Encounter for gynecological examination (general) (routine) without abnormal findings: Secondary | ICD-10-CM | POA: Diagnosis present

## 2019-03-30 ENCOUNTER — Other Ambulatory Visit: Payer: BC Managed Care – PPO

## 2019-11-04 IMAGING — MR MR HEAD W/O CM
3 series · 48 of 48 positions shown · non-contrast
Comparison: None.

CLINICAL DATA: Mental status changes with cognitive defects.
Weakness.

EXAM:
MRI HEAD WITHOUT CONTRAST
TECHNIQUE: Multiplanar, multiecho pulse sequences of the brain and surrounding
structures were obtained without intravenous contrast.
Additionally, using NeuroQuant software a 3D volumetric analysis of
the brain was performed and is compared to a normative database
adjusted for age, gender and intracranial volume.

[Series 52: nqsegcb_sc_cor · 1.00mm/px · 17 of 233 slices shown]
[im 1/233]
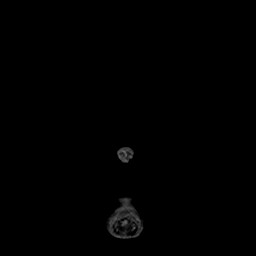
[im 15/233]
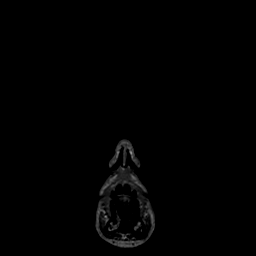
[im 30/233]
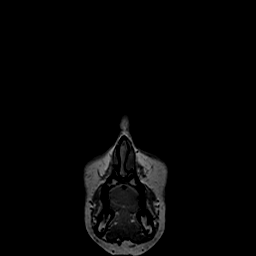
[im 44/233]
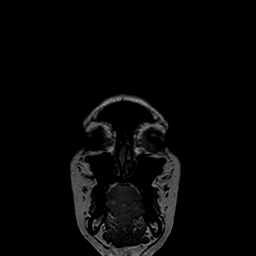
[im 59/233]
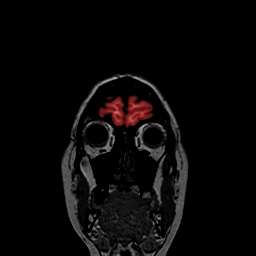
[im 73/233]
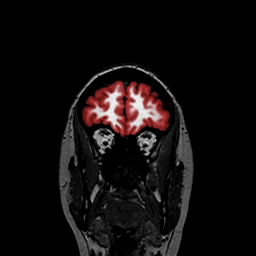
[im 88/233]
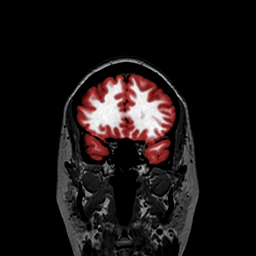
[im 102/233]
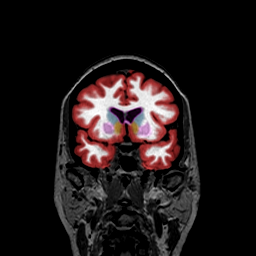
[im 117/233]
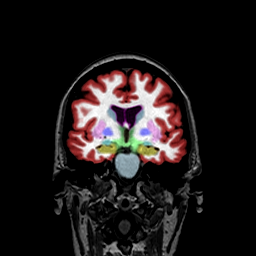
[im 131/233]
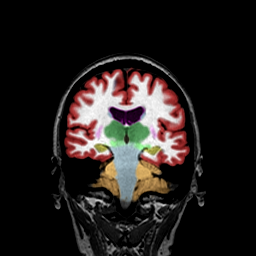
[im 146/233]
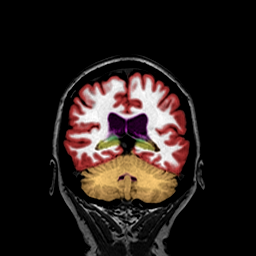
[im 160/233]
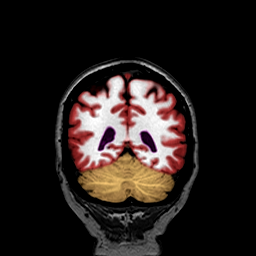
[im 175/233]
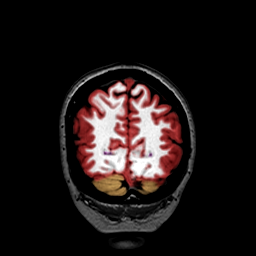
[im 189/233]
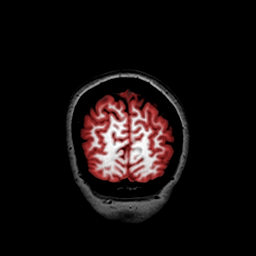
[im 204/233]
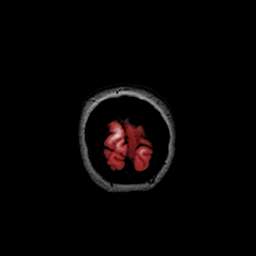
[im 218/233]
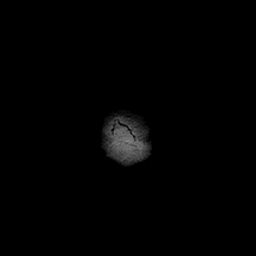
[im 233/233]
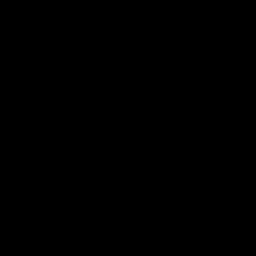

[Series 53: nqsegcb_sc_axl · 1.00mm/px · 16 of 214 slices shown]
[im 1/214]
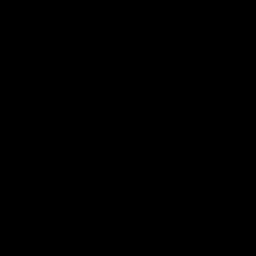
[im 15/214]
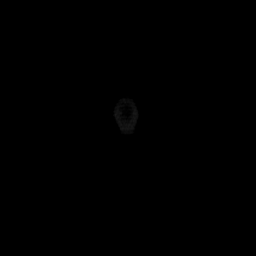
[im 29/214]
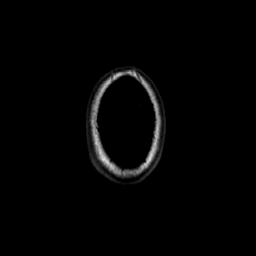
[im 43/214]
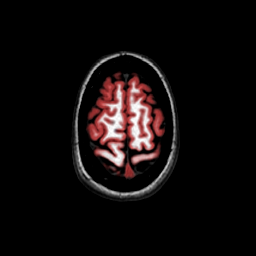
[im 57/214]
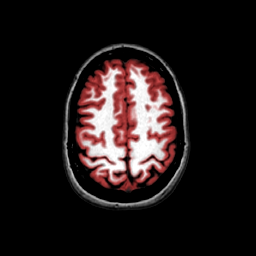
[im 72/214]
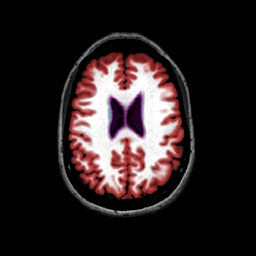
[im 86/214]
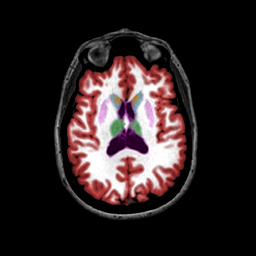
[im 100/214]
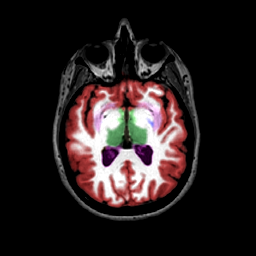
[im 114/214]
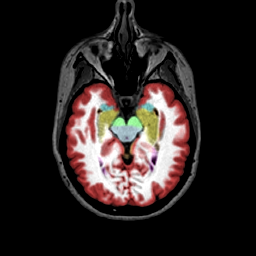
[im 128/214]
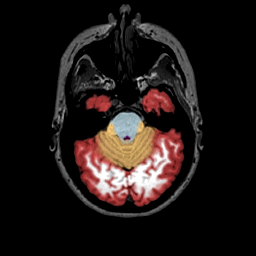
[im 143/214]
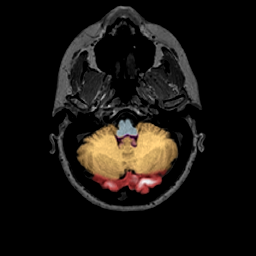
[im 157/214]
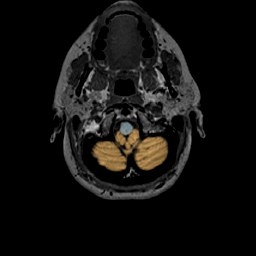
[im 171/214]
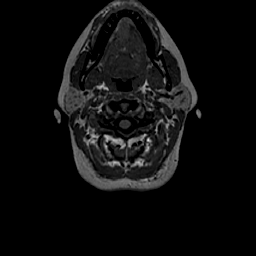
[im 185/214]
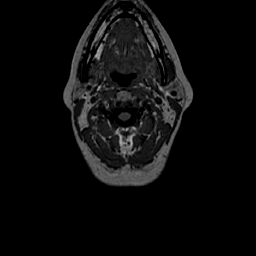
[im 199/214]
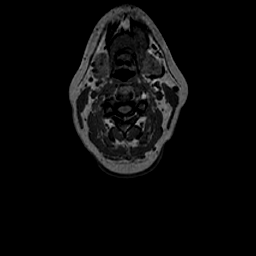
[im 214/214]
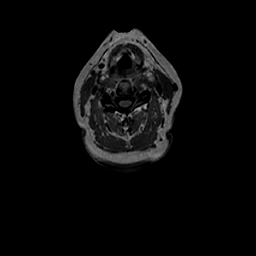

[Series 54: nqsegcb_sc_sag · 1.00mm/px · 15 of 195 slices shown]
[im 1/195]
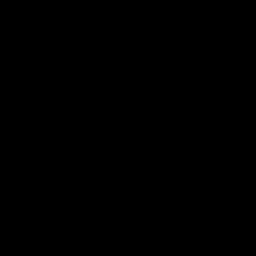
[im 14/195]
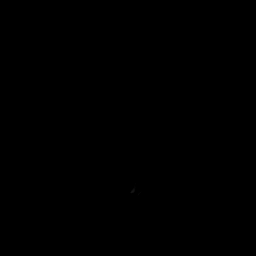
[im 28/195]
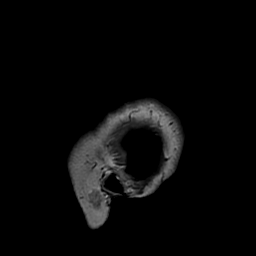
[im 42/195]
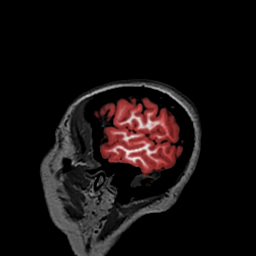
[im 56/195]
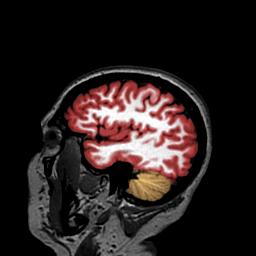
[im 70/195]
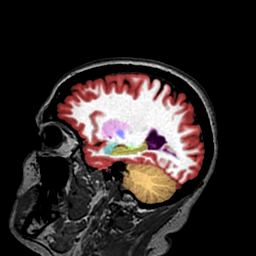
[im 84/195]
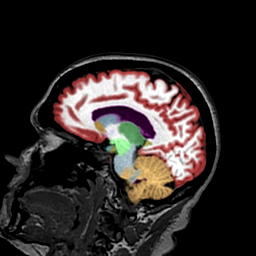
[im 98/195]
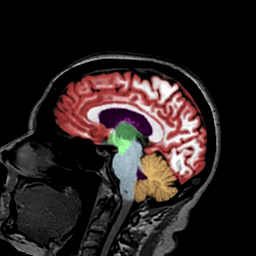
[im 111/195]
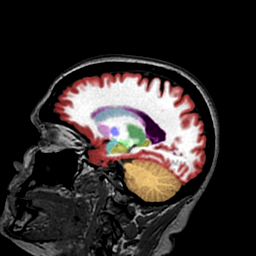
[im 125/195]
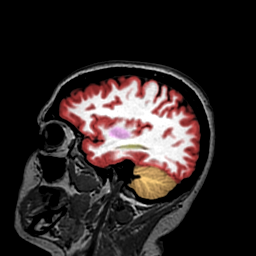
[im 139/195]
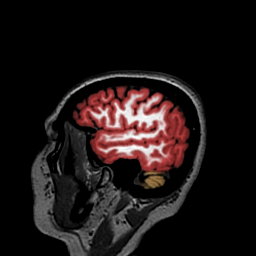
[im 153/195]
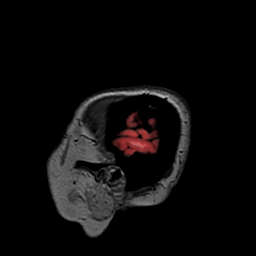
[im 167/195]
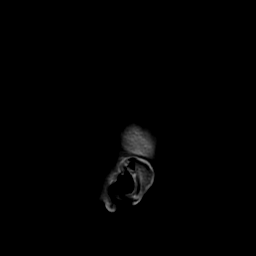
[im 181/195]
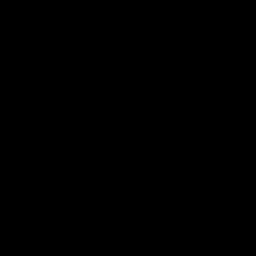
[im 195/195]
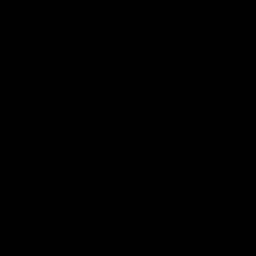

[48 of 48 positions shown; findings below may reference images not displayed]

FINDINGS: Brain: No infarction, hemorrhage, hydrocephalus, extra-axial
collection or mass lesion. Mild undulation of posterolateral
ventricles without underlying heterotopic gray matter or white
matter volume loss. Few remote small white matter insults, less than
5, nonspecific with this pattern and very mild degree. No chronic
blood products. No abnormal mineralization. Subjectively normal
brain volume. NeuroQuant study has typical mapping quality. Brain
volume subjectively normal, which matches NeuroQuant reporting.

Vascular: Normal flow voids.

Skull and upper cervical spine: Normal marrow signal. Mild
right-sided cervical facet spurring.

Sinuses/Orbits: Negative

NeuroQuant Findings:

Volumetric analysis of the brain was performed, with a fully
detailed report in [HOSPITAL] PACS. Briefly, the comparison with age and
gender matched reference reveals normal and symmetric brain volume.
IMPRESSION: 1. No explanation for symptoms.
2. NeuroQuant volumetric analysis of the brain, see details on
[HOSPITAL] PACS.
3. Few remote white matter insults, usually incidental when seen to
this mild degree.

## 2020-04-10 ENCOUNTER — Other Ambulatory Visit: Payer: Self-pay | Admitting: Internal Medicine

## 2020-04-26 ENCOUNTER — Other Ambulatory Visit: Payer: Self-pay

## 2020-04-26 ENCOUNTER — Ambulatory Visit (INDEPENDENT_AMBULATORY_CARE_PROVIDER_SITE_OTHER): Payer: BC Managed Care – PPO | Admitting: Obstetrics and Gynecology

## 2020-04-26 ENCOUNTER — Encounter: Payer: Self-pay | Admitting: Obstetrics and Gynecology

## 2020-04-26 VITALS — BP 93/68 | HR 88 | Ht 65.0 in | Wt 187.1 lb

## 2020-04-26 DIAGNOSIS — Z01419 Encounter for gynecological examination (general) (routine) without abnormal findings: Secondary | ICD-10-CM

## 2020-04-26 DIAGNOSIS — Z1231 Encounter for screening mammogram for malignant neoplasm of breast: Secondary | ICD-10-CM

## 2020-04-26 DIAGNOSIS — N951 Menopausal and female climacteric states: Secondary | ICD-10-CM | POA: Diagnosis not present

## 2020-04-26 MED ORDER — ESTRADIOL 0.05 MG/24HR TD PTTW: 1.0000 | MEDICATED_PATCH | TRANSDERMAL | 3 refills | Status: AC

## 2020-04-26 NOTE — Progress Notes (Signed)
HPI:      [name] is a 55 y.o. [phone] who LMP was No LMP recorded. Patient has had a hysterectomy.  Subjective:   She presents today for her annual examination.  She has no complaints.  She is using the Vivelle-Dot and would like to continue. Of significant note, her mother was recently diagnosed with breast cancer.  She will undergo a lumpectomy in the near future. Virginia Oconnor has had genetic testing specifically because of a strong family history of colon cancer but she tested negative for colon and breast too. She states that she is up-to-date on her colonoscopies but thinks that she may be due this year for her next one. She sees Dr. Graciela Husbands for general medical care. She does occasionally have vaginal dryness with intercourse and uses lubrication.    Hx: The following portions of the patient's history were reviewed and updated as appropriate:             She  has a past medical history of Adenomyosis, Anxiety, B12 deficiency, Cervical dysplasia, Chronic constipation, Depression, Dysmenorrhea, Hemochromatosis, IBS (irritable bowel syndrome), Internal hemorrhoid, Menopause, Migraines, Thyroid disease, TMJ (dislocation of temporomandibular joint), and Vaginal dryness. She does not have any pertinent problems on file. She  has a past surgical history that includes Hemorrhoid banding; LEEP; Carpal tunnel release; Cervical cerclage; and Cervical conization w/bx. Her family history includes Breast cancer in her paternal aunt; Cirrhosis in her father; Colon cancer in her father, maternal uncle, and paternal uncle; Hodgkin's lymphoma in her mother; Lung cancer in her paternal grandfather; Prostate cancer in her father, paternal grandfather, and paternal uncle. She  reports that she has never smoked. She has never used smokeless tobacco. She reports current alcohol use. She reports that she does not use drugs. She has a current medication list which includes the following prescription(s):  b-12, estradiol, levothyroxine, spironolactone, tretinoin, valacyclovir, vitamin d (ergocalciferol), bupropion, duloxetine, estradiol, furosemide, and linaclotide. She has No Known Allergies.       Review of Systems:  Review of Systems  Constitutional: Denied constitutional symptoms, night sweats, recent illness, fatigue, fever, insomnia and weight loss.  Eyes: Denied eye symptoms, eye pain, photophobia, vision change and visual disturbance.  Ears/Nose/Throat/Neck: Denied ear, nose, throat or neck symptoms, hearing loss, nasal discharge, sinus congestion and sore throat.  Cardiovascular: Denied cardiovascular symptoms, arrhythmia, chest pain/pressure, edema, exercise intolerance, orthopnea and palpitations.  Respiratory: Denied pulmonary symptoms, asthma, pleuritic pain, productive sputum, cough, dyspnea and wheezing.  Gastrointestinal: Denied, gastro-esophageal reflux, melena, nausea and vomiting.  Genitourinary: Denied genitourinary symptoms including symptomatic vaginal discharge, pelvic relaxation issues, and urinary complaints.  Musculoskeletal: Denied musculoskeletal symptoms, stiffness, swelling, muscle weakness and myalgia.  Dermatologic: Denied dermatology symptoms, rash and scar.  Neurologic: Denied neurology symptoms, dizziness, headache, neck pain and syncope.  Psychiatric: Denied psychiatric symptoms, anxiety and depression.  Endocrine: Denied endocrine symptoms including hot flashes and night sweats.   Meds:   Current Outpatient Medications on File Prior to Visit  Medication Sig Dispense Refill  . Cyanocobalamin (B-12) 5000 MCG SUBL Place under the tongue.    Marland Kitchen estradiol (VIVELLE-DOT) 0.025 MG/24HR Place 1 patch onto the skin 2 (two) times a week. 24 patch 3  . levothyroxine (SYNTHROID, LEVOTHROID) 50 MCG tablet Take by mouth.    . spironolactone (ALDACTONE) 50 MG tablet Take 50 mg by mouth daily.    Marland Kitchen tretinoin (RETIN-A) 0.025 % cream Apply topically at bedtime.    .  valACYclovir (VALTREX) 1000 MG tablet Take  1,000 mg by mouth 2 (two) times daily.    . Vitamin D, Ergocalciferol, (DRISDOL) 50000 units CAPS capsule Take 50,000 Units by mouth every 7 (seven) days.    Marland Kitchen buPROPion (WELLBUTRIN) 75 MG tablet Take 75 mg by mouth 2 (two) times daily. (Patient not taking: Reported on 04/26/2020)    . DULoxetine (CYMBALTA) 30 MG capsule Take 30 mg by mouth daily.    Marland Kitchen estradiol (ESTRACE) 0.1 MG/GM vaginal cream Place 1 Applicatorful vaginally at bedtime. (Patient not taking: Reported on 04/26/2020) 42.5 g 6  . furosemide (LASIX) 20 MG tablet Take as needed as directed (Patient not taking: Reported on 04/26/2020)    . linaclotide (LINZESS) 290 MCG CAPS capsule Take by mouth. (Patient not taking: Reported on 04/26/2020)     No current facility-administered medications on file prior to visit.          Objective:     Vitals:   04/26/20 0803  BP: 93/68  Pulse: 88    Filed Weights   04/26/20 0803  Weight: 187 lb 1.6 oz (84.9 kg)              Physical examination General NAD, Conversant  HEENT Atraumatic; Op clear with mmm.  Normo-cephalic. Pupils reactive. Anicteric sclerae  Thyroid/Neck Smooth without nodularity or enlargement. Normal ROM.  Neck Supple.  Skin No rashes, lesions or ulceration. Normal palpated skin turgor. No nodularity.  Breasts: No masses or discharge.  Symmetric.  No axillary adenopathy.  Lungs: Clear to auscultation.No rales or wheezes. Normal Respiratory effort, no retractions.  Heart: NSR.  No murmurs or rubs appreciated. No periferal edema  Abdomen: Soft.  Non-tender.  No masses.  No HSM. No hernia  Extremities: Moves all appropriately.  Normal ROM for age. No lymphadenopathy.  Neuro: Oriented to PPT.  Normal mood. Normal affect.     Pelvic:   Vulva: Normal appearance.  No lesions.   Vagina: No lesions or abnormalities noted.  Support: Normal pelvic support.  Urethra No masses tenderness or scarring.  Meatus Normal size without  lesions or prolapse.  Cervix: Surgically absent   Anus: Normal exam.  No lesions.  Perineum: Normal exam.  No lesions.        Bimanual   Uterus: Surgically absent   Adnexae: No masses.  Non-tender to palpation.  Cul-de-sac: Negative for abnormality.     Assessment:    [id number] Patient Active Problem List   Diagnosis Date Noted  . Chronic constipation 08/13/2015  . Hyperlipidemia, unspecified 08/13/2015  . Hypothyroidism due to acquired atrophy of thyroid 08/13/2015  . Migraines 02/07/2015  . TMJ (dislocation of temporomandibular joint) 02/07/2015  . IBS (irritable bowel syndrome) 02/07/2015  . Internal hemorrhoids 02/07/2015  . Other specified anxiety disorders 02/07/2015  . Menopausal state 02/07/2015     1. Well woman exam with routine gynecological exam   2. Encounter for screening mammogram for malignant neoplasm of breast   3. Symptomatic menopausal or female climacteric states        Plan:            1.  Basic Screening Recommendations The basic screening recommendations for asymptomatic women were discussed with the patient during her visit.  The age-appropriate recommendations were discussed with her and the rational for the tests reviewed.  When I am informed by the patient that another primary care physician has previously obtained the age-appropriate tests and they are up-to-date, only outstanding tests are ordered and referrals given as necessary.  Abnormal results of tests  will be discussed with her when all of her results are completed.  Routine preventative health maintenance measures emphasized: Exercise/Diet/Weight control, Tobacco Warnings, Alcohol/Substance use risks and Stress Management Mammography ordered 2.  We specifically discussed Vivelle-Dot and the use of estrogen with a family history of breast cancer.  Risks and benefits discussed.  Patient will remain on Vivelle dot. 3.  Patient will do a trial of vaginal estrogen cream for vaginal dryness with  intercourse.  If this is successful we will prescribe Premarin vaginal cream. Orders Orders Placed This Encounter  Procedures  . MM 3D SCREEN BREAST BILATERAL    No orders of the defined types were placed in this encounter.       F/U  Return in about 1 year (around 04/26/2021) for Annual Physical.  Elonda Husky, M.D. 04/26/2020 9:28 AM

## 2020-04-29 ENCOUNTER — Ambulatory Visit
Admission: RE | Admit: 2020-04-29 | Discharge: 2020-04-29 | Disposition: A | Discharge status: Home / Self Care | Payer: BC Managed Care – PPO | Source: Ambulatory Visit | Attending: Obstetrics and Gynecology | Admitting: Obstetrics and Gynecology

## 2020-04-29 ENCOUNTER — Other Ambulatory Visit: Payer: Self-pay

## 2020-04-29 DIAGNOSIS — Z1231 Encounter for screening mammogram for malignant neoplasm of breast: Secondary | ICD-10-CM | POA: Diagnosis present

## 2021-04-30 ENCOUNTER — Other Ambulatory Visit: Payer: Self-pay | Admitting: Internal Medicine

## 2021-05-02 ENCOUNTER — Encounter: Payer: Self-pay | Admitting: Obstetrics and Gynecology

## 2023-05-12 ENCOUNTER — Other Ambulatory Visit: Payer: Self-pay | Admitting: Internal Medicine

## 2023-05-12 DIAGNOSIS — Z1231 Encounter for screening mammogram for malignant neoplasm of breast: Secondary | ICD-10-CM

## 2023-05-25 ENCOUNTER — Ambulatory Visit
Admission: RE | Admit: 2023-05-25 | Discharge: 2023-05-25 | Disposition: A | Discharge status: Home / Self Care | Payer: Self-pay | Source: Ambulatory Visit | Attending: Internal Medicine | Admitting: Internal Medicine

## 2023-05-25 DIAGNOSIS — Z1231 Encounter for screening mammogram for malignant neoplasm of breast: Secondary | ICD-10-CM | POA: Diagnosis present

## 2023-10-26 ENCOUNTER — Other Ambulatory Visit: Payer: Self-pay | Admitting: Internal Medicine

## 2023-10-26 DIAGNOSIS — M5416 Radiculopathy, lumbar region: Secondary | ICD-10-CM

## 2023-10-28 ENCOUNTER — Ambulatory Visit
Admission: RE | Admit: 2023-10-28 | Discharge: 2023-10-28 | Disposition: A | Discharge status: Home / Self Care | Source: Ambulatory Visit | Attending: Internal Medicine | Admitting: Internal Medicine

## 2023-10-28 DIAGNOSIS — M5416 Radiculopathy, lumbar region: Secondary | ICD-10-CM | POA: Diagnosis present
